# Patient Record
Sex: Female | Born: 1955 | Race: White | Hispanic: No | Marital: Married | Smoking: Never smoker
Health system: Southern US, Community
[De-identification: ages and names within clinical notes are randomized; demographics above are authoritative.]

## PROBLEM LIST (undated history)

## (undated) DIAGNOSIS — E079 Disorder of thyroid, unspecified: Secondary | ICD-10-CM

## (undated) DIAGNOSIS — K219 Gastro-esophageal reflux disease without esophagitis: Secondary | ICD-10-CM

## (undated) DIAGNOSIS — I10 Essential (primary) hypertension: Secondary | ICD-10-CM

## (undated) HISTORY — DX: Gastro-esophageal reflux disease without esophagitis: K21.9

## (undated) HISTORY — PX: COSMETIC SURGERY: SHX468

---

## 2004-08-08 HISTORY — PX: CHOLECYSTECTOMY: SHX55

## 2008-05-28 ENCOUNTER — Ambulatory Visit: Payer: Self-pay

## 2010-04-20 ENCOUNTER — Ambulatory Visit: Payer: Self-pay | Admitting: Endocrinology

## 2013-02-17 ENCOUNTER — Ambulatory Visit: Payer: Self-pay | Admitting: Emergency Medicine

## 2013-08-25 ENCOUNTER — Ambulatory Visit: Payer: Self-pay | Admitting: Physician Assistant

## 2014-03-14 DIAGNOSIS — E890 Postprocedural hypothyroidism: Secondary | ICD-10-CM | POA: Insufficient documentation

## 2017-05-16 ENCOUNTER — Ambulatory Visit: Payer: Self-pay | Admitting: Podiatry

## 2017-06-13 ENCOUNTER — Ambulatory Visit: Payer: Self-pay | Admitting: Podiatry

## 2017-09-04 ENCOUNTER — Telehealth: Payer: Self-pay | Admitting: Internal Medicine

## 2017-09-04 NOTE — Telephone Encounter (Signed)
Please advise    Copied from [place]. Topic: Inquiry >> Sep 04, 2017 12:54 PM Neva Seat wrote: Aveline's  Mother - In - Law - Jessica Priest  and  Chandler will no longer be a pts of Dr. Derrel Nip.  They are moving to [place] soon. Karalyne is asking since Dr. Derrel Nip will loose 2 pts if she can become a pt of Dr. Lupita Dawn.

## 2017-09-04 NOTE — Telephone Encounter (Signed)
Please advise 

## 2017-09-04 NOTE — Telephone Encounter (Signed)
I cannot accept any more patients at this time .  I 'm sorry

## 2017-09-05 NOTE — Telephone Encounter (Signed)
Pt called back and I informed her of message.

## 2017-09-05 NOTE — Telephone Encounter (Signed)
LMTCB. Need to let pt know that Dr. Derrel Nip is not accepting any pt's at this time and that she is sorry. PEC may speak with pt.

## 2017-10-27 ENCOUNTER — Encounter: Payer: Self-pay | Admitting: *Deleted

## 2017-10-27 ENCOUNTER — Ambulatory Visit
Admission: EM | Admit: 2017-10-27 | Discharge: 2017-10-27 | Disposition: A | Discharge status: Home / Self Care | Payer: Self-pay | Attending: Family Medicine | Admitting: Family Medicine

## 2017-10-27 DIAGNOSIS — H6503 Acute serous otitis media, bilateral: Secondary | ICD-10-CM

## 2017-10-27 HISTORY — DX: Essential (primary) hypertension: I10

## 2017-10-27 HISTORY — DX: Disorder of thyroid, unspecified: E07.9

## 2017-10-27 MED ORDER — AMOXICILLIN 875 MG PO TABS: 875.0000 mg | ORAL_TABLET | Freq: Two times a day (BID) | ORAL | 0 refills | Status: DC

## 2017-10-27 NOTE — ED Provider Notes (Signed)
[facility]    CSN: [id number] Arrival date & time: 10/27/17  1449     History   Chief Complaint Chief Complaint  Patient presents with  . Otalgia  . Facial Pain  . Headache    HPI DONEISHA IVEY is a 62 y.o. female.   The history is provided by the patient.  Otalgia  Location:  Bilateral (right greater than left) Quality:  Aching Severity:  Mild Onset quality:  Sudden Duration:  3 days Timing:  Constant Progression:  Worsening Context: recent URI   Context: not direct blow, not elevation change, not foreign body in ear, not loud noise and not water in ear   Relieved by:  None tried Associated symptoms: congestion and headaches   Associated symptoms: no abdominal pain, no cough, no diarrhea, no ear discharge, no fever, no hearing loss, no neck pain, no rash, no rhinorrhea, no sore throat, no tinnitus and no vomiting   Risk factors: no recent travel, no chronic ear infection and no prior ear surgery   Headache  Associated symptoms: congestion and ear pain   Associated symptoms: no abdominal pain, no cough, no diarrhea, no fever, no hearing loss, no neck pain, no sore throat and no vomiting     Past Medical History:  Diagnosis Date  . Hypertension   . Thyroid disease     There are no active problems to display for this patient.   Past Surgical History:  Procedure Laterality Date  . CHOLECYSTECTOMY      OB History   None      Home Medications    Prior to Admission medications   Medication Sig Start Date End Date Taking? Authorizing Provider  amoxicillin (AMOXIL) 875 MG tablet Take 1 tablet (875 mg total) by mouth 2 (two) times daily. 10/27/17   Norval Gable, MD    Family History Family History  Problem Relation Age of Onset  . Anuerysm Mother   . Congestive Heart Failure Father     Social History Social History   Tobacco Use  . Smoking status: Never Smoker  . Smokeless tobacco: Never Used  Substance Use Topics  . Alcohol  use: Yes  . Drug use: Never     Allergies   Patient has no known allergies.   Review of Systems Review of Systems  Constitutional: Negative for fever.  HENT: Positive for congestion and ear pain. Negative for ear discharge, hearing loss, rhinorrhea, sore throat and tinnitus.   Respiratory: Negative for cough.   Gastrointestinal: Negative for abdominal pain, diarrhea and vomiting.  Musculoskeletal: Negative for neck pain.  Skin: Negative for rash.  Neurological: Positive for headaches.     Physical Exam Triage Vital Signs ED Triage Vitals  Enc Vitals Group     BP 10/27/17 1513 132/83     Pulse Rate 10/27/17 1513 78     Resp 10/27/17 1513 16     Temp 10/27/17 1513 98.4 F (36.9 C)     Temp Source 10/27/17 1513 Oral     SpO2 10/27/17 1513 100 %     Weight 10/27/17 1519 200 lb (90.7 kg)     Height 10/27/17 1519 5\' 11"  (1.803 m)     Head Circumference --      Peak Flow --      Pain Score 10/27/17 1518 0     Pain Loc --      Pain Edu? --      Excl. in [place]? --    No  data found.  Updated Vital Signs BP 132/83 (BP Location: Left Arm)   Pulse 78   Temp 98.4 F (36.9 C) (Oral)   Resp 16   Ht 5\' 11"  (1.803 m)   Wt 200 lb (90.7 kg)   SpO2 100%   BMI 27.89 kg/m   Visual Acuity Right Eye Distance:   Left Eye Distance:   Bilateral Distance:    Right Eye Near:   Left Eye Near:    Bilateral Near:     Physical Exam  Constitutional: She appears well-developed and well-nourished. No distress.  HENT:  Head: Normocephalic and atraumatic.  Right Ear: External ear and ear canal normal. Tympanic membrane is erythematous and bulging.  Left Ear: External ear and ear canal normal. Tympanic membrane is erythematous and bulging.  Nose: Mucosal edema and rhinorrhea present. No nose lacerations, sinus tenderness, nasal deformity, septal deviation or nasal septal hematoma. No epistaxis.  No foreign bodies. Right sinus exhibits maxillary sinus tenderness and frontal sinus  tenderness. Left sinus exhibits maxillary sinus tenderness and frontal sinus tenderness.  Mouth/Throat: Uvula is midline, oropharynx is clear and moist and mucous membranes are normal. No oropharyngeal exudate.  Eyes: Pupils are equal, round, and reactive to light. Conjunctivae and EOM are normal. Right eye exhibits no discharge. Left eye exhibits no discharge. No scleral icterus.  Neck: Normal range of motion. Neck supple. No thyromegaly present.  Cardiovascular: Normal rate, regular rhythm and normal heart sounds.  Pulmonary/Chest: Effort normal and breath sounds normal. No respiratory distress. She has no wheezes. She has no rales.  Lymphadenopathy:    She has no cervical adenopathy.  Skin: She is not diaphoretic.  Nursing note and vitals reviewed.    UC Treatments / Results  Labs (all labs ordered are listed, but only abnormal results are displayed) Labs Reviewed - No data to display  EKG None Radiology No results found.  Procedures Procedures (including critical care time)  Medications Ordered in UC Medications - No data to display   Initial Impression / Assessment and Plan / UC Course  I have reviewed the triage vital signs and the nursing notes.  Pertinent labs & imaging results that were available during my care of the patient were reviewed by me and considered in my medical decision making (see chart for details).       Final Clinical Impressions(s) / UC Diagnoses   Final diagnoses:  Bilateral acute serous otitis media, recurrence not specified    ED Discharge Orders        Ordered    amoxicillin (AMOXIL) 875 MG tablet  2 times daily     10/27/17 1619     1. diagnosis reviewed with patient 2. rx as per orders above; reviewed possible side effects, interactions, risks and benefits  3. Recommend supportive treatment with otc analgesics prn 4. Follow-up prn if symptoms worsen or don't improve  Controlled Substance Prescriptions [place] Controlled Substance  Registry consulted? Not Applicable   Norval Gable, MD 10/27/17 [phone]

## 2017-10-27 NOTE — ED Triage Notes (Signed)
Headache, right ear pain with "roaring" sensation and muted hearing, nasal discharge, head congestion since yesterday.

## 2017-11-20 DIAGNOSIS — E669 Obesity, unspecified: Secondary | ICD-10-CM | POA: Insufficient documentation

## 2017-11-20 DIAGNOSIS — I10 Essential (primary) hypertension: Secondary | ICD-10-CM | POA: Insufficient documentation

## 2017-11-21 ENCOUNTER — Ambulatory Visit: Payer: Self-pay | Admitting: Podiatry

## 2017-11-21 ENCOUNTER — Encounter: Payer: Self-pay | Admitting: Podiatry

## 2017-11-21 DIAGNOSIS — B351 Tinea unguium: Secondary | ICD-10-CM

## 2017-11-21 MED ORDER — TERBINAFINE HCL 250 MG PO TABS: 250.0000 mg | ORAL_TABLET | Freq: Every day | ORAL | 0 refills | Status: DC

## 2017-11-22 LAB — HEPATIC FUNCTION PANEL
ALBUMIN: 4.7 g/dL (ref 3.6–4.8)
ALT: 20 IU/L (ref 0–32)
AST: 17 IU/L (ref 0–40)
Alkaline Phosphatase: 82 IU/L (ref 39–117)
BILIRUBIN TOTAL: 0.9 mg/dL (ref 0.0–1.2)
BILIRUBIN, DIRECT: 0.18 mg/dL (ref 0.00–0.40)
Total Protein: 7.1 g/dL (ref 6.0–8.5)

## 2017-11-24 NOTE — Progress Notes (Signed)
   Subjective: 62 year old female presenting today as a new patient with a chief complaint of a thickened, discolored right great toe that appeared about one year ago. She reports associated pain to the nail bed and distal portion of the nail. Wearing shoes increases this pain. She has not had any treatment. Patient is here for further evaluation and treatment.   Past Medical History:  Diagnosis Date  . Hypertension   . Thyroid disease     Objective: Physical Exam General: The patient is alert and oriented x3 in no acute distress.  Dermatology: Hyperkeratotic, discolored, thickened, onychodystrophy of the right great toenail.  Skin is warm, dry and supple bilateral lower extremities. Negative for open lesions or macerations.  Vascular: Palpable pedal pulses bilaterally. No edema or erythema noted. Capillary refill within normal limits.  Neurological: Epicritic and protective threshold grossly intact bilaterally.   Musculoskeletal Exam: Range of motion within normal limits to all pedal and ankle joints bilateral. Muscle strength 5/5 in all groups bilateral.   Assessment: #1  Onychomycosis right great toe  Plan of Care:  #1 Patient was evaluated. #2  Recommend over-the-counter antifungal medication. #3 recommend good supportive shoe gear that is not too tight in the toe box area #4 return to clinic as needed   Edrick Kins, DPM [facility]  Dr. Edrick Kins, [address]                                        [place], [place] [address]                Office [phone]  Fax [phone]

## 2017-11-28 ENCOUNTER — Telehealth: Payer: Self-pay | Admitting: *Deleted

## 2017-11-28 NOTE — Telephone Encounter (Signed)
Pt states she would like to know the results of her liver function test so she can begin the lamisil.

## 2017-11-29 NOTE — Telephone Encounter (Signed)
Patient has been notified via voice mail that labs normal, she may start taking the Lamisil daily and if any problems with the medication, she is to d/c medication and contact our office

## 2017-12-24 ENCOUNTER — Encounter: Payer: Self-pay | Admitting: Gynecology

## 2017-12-24 ENCOUNTER — Ambulatory Visit (INDEPENDENT_AMBULATORY_CARE_PROVIDER_SITE_OTHER): Payer: Self-pay

## 2017-12-24 ENCOUNTER — Ambulatory Visit
Admission: EM | Admit: 2017-12-24 | Discharge: 2017-12-24 | Disposition: A | Discharge status: Home / Self Care | Payer: Self-pay | Attending: Family Medicine | Admitting: Family Medicine

## 2017-12-24 ENCOUNTER — Other Ambulatory Visit: Payer: Self-pay

## 2017-12-24 DIAGNOSIS — J40 Bronchitis, not specified as acute or chronic: Secondary | ICD-10-CM

## 2017-12-24 DIAGNOSIS — R05 Cough: Secondary | ICD-10-CM

## 2017-12-24 MED ORDER — HYDROCOD POLST-CPM POLST ER 10-8 MG/5ML PO SUER: 5.0000 mL | Freq: Every evening | ORAL | 0 refills | Status: DC | PRN

## 2017-12-24 MED ORDER — PREDNISONE 50 MG PO TABS: ORAL_TABLET | ORAL | 0 refills | Status: DC

## 2017-12-24 MED ORDER — DOXYCYCLINE HYCLATE 100 MG PO CAPS: 100.0000 mg | ORAL_CAPSULE | Freq: Two times a day (BID) | ORAL | 0 refills | Status: DC

## 2017-12-24 NOTE — ED Provider Notes (Signed)
[facility]    CSN: [id number] Arrival date & time: 12/24/17  1235  History   Chief Complaint Chief Complaint  Patient presents with  . Cough   HPI  62 year old female presents with cough.  Patient reports that she has not felt well for the past 3 weeks.  She has had upper respiratory symptoms but now is plagued by productive cough.  Associated congestion and fatigue.  She has failed to improve despite time and rest.  No reports of fever.  No shortness of breath.  No known exacerbating relieving factors.  No other associated symptoms.  No other complaints.  Past Medical History:  Diagnosis Date  . Hypertension   . Thyroid disease    Patient Active Problem List   Diagnosis Date Noted  . Obesity (BMI 30-39.9) 11/20/2017  . Hypertension 11/20/2017  . 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 CHOLECYSTECTOMY     OB History   None    Home Medications    Prior to Admission medications   Medication Sig Start Date End Date Taking? Authorizing Provider  amLODipine (NORVASC) 5 MG tablet  10/04/17  Yes [provider]  ARMOUR THYROID 90 MG tablet TK 1 T PO D FOR 6 DAYS OF THE WEEK AND 2 TS ON SUNDAYS UTD 08/30/17  Yes [provider]  chlorpheniramine-HYDROcodone (TUSSIONEX PENNKINETIC ER) 10-8 MG/5ML SUER Take 5 mLs by mouth at bedtime as needed. 12/24/17   Coral Spikes, DO  doxycycline (VIBRAMYCIN) 100 MG capsule Take 1 capsule (100 mg total) by mouth 2 (two) times daily. 12/24/17   Coral Spikes, DO  lisinopril (PRINIVIL,ZESTRIL) 40 MG tablet  10/03/17   [provider]  predniSONE (DELTASONE) 50 MG tablet 1 tablet daily x 5 days. 12/24/17   Coral Spikes, DO  terbinafine (LAMISIL) 250 MG tablet Take 1 tablet (250 mg total) by mouth daily. 11/21/17   Edrick Kins, DPM   Family History Family History  Problem Relation Age of Onset  . Anuerysm Mother   . Congestive Heart Failure Father      Social History Social History   Tobacco Use  . Smoking status: Never Smoker  . Smokeless tobacco: Never Used  Substance Use Topics  . Alcohol use: Yes  . Drug use: Never   Allergies   Patient has no known allergies.  Review of Systems Review of Systems  Constitutional: Positive for fatigue.  HENT: Positive for congestion.   Respiratory: Positive for cough.    Physical Exam Triage Vital Signs ED Triage Vitals  Enc Vitals Group     BP 12/24/17 1245 (!) 139/95     Pulse Rate 12/24/17 1245 85     Resp 12/24/17 1245 16     Temp 12/24/17 1245 (!) 97.5 F (36.4 C)     Temp Source 12/24/17 1245 Oral     SpO2 12/24/17 1245 96 %     Weight 12/24/17 1248 200 lb (90.7 kg)     Height --      Head Circumference --      Peak Flow --      Pain Score 12/24/17 1248 0     Pain Loc --      Pain Edu? --      Excl. in [place]? --    Updated Vital Signs BP (!) 139/95 (BP Location: Left Arm)   Pulse 85   Temp (!) 97.5 F (36.4 C) (Oral)   Resp  16   Wt 200 lb (90.7 kg)   SpO2 96%   BMI 27.89 kg/m   Physical Exam  Constitutional: She is oriented to person, place, and time. She appears well-developed. No distress.  HENT:  Head: Normocephalic and atraumatic.  Nose: Nose normal.  Eyes: Conjunctivae are normal. Right eye exhibits no discharge. Left eye exhibits no discharge.  Cardiovascular: Normal rate and regular rhythm.  Pulmonary/Chest: Effort normal and breath sounds normal. She has no wheezes. She has no rales.  Neurological: She is alert and oriented to person, place, and time.  Psychiatric: She has a normal mood and affect. Her behavior is normal.  Nursing note and vitals reviewed.  UC Treatments / Results  Labs (all labs ordered are listed, but only abnormal results are displayed) Labs Reviewed - No data to display  EKG None  Radiology Dg Chest 2 View  Result Date: 12/24/2017 CLINICAL DATA:  Cough for 3 weeks EXAM: CHEST - 2 VIEW COMPARISON:  None. FINDINGS: Normal  heart size. Convexity of the right mediastinum is attributed to ascending aortic contour. The superimposed hilum has a normal morphology and hila appear normal on the lateral view. Biapical pleural based calcification. There is no edema, consolidation, effusion, or pneumothorax. IMPRESSION: Negative for pneumonia. Electronically Signed   By: Monte Fantasia M.D.   On: 12/24/2017 13:47   Procedures Procedures (including critical care time)  Medications Ordered in UC Medications - No data to display  Initial Impression / Assessment and Plan / UC Course  I have reviewed the triage vital signs and the nursing notes.  Pertinent labs & imaging results that were available during my care of the patient were reviewed by me and considered in my medical decision making (see chart for details).    62 year old female presents with symptoms consistent with bronchitis.  Given duration of illness and lack of improvement, I am treating her with doxycycline, prednisone.  Tussionex for cough.  Final Clinical Impressions(s) / UC Diagnoses   Final diagnoses:  Bronchitis     Discharge Instructions     Meds as prescribed.  Take care  Dr. Lacinda Axon     ED Prescriptions    Medication Sig Dispense Auth. Provider   doxycycline (VIBRAMYCIN) 100 MG capsule Take 1 capsule (100 mg total) by mouth 2 (two) times daily. 14 capsule Amahd Morino G, DO   predniSONE (DELTASONE) 50 MG tablet 1 tablet daily x 5 days. 5 tablet [place], Debra Calabretta G, DO   chlorpheniramine-HYDROcodone (TUSSIONEX PENNKINETIC ER) 10-8 MG/5ML SUER Take 5 mLs by mouth at bedtime as needed. 60 mL Coral Spikes, DO     Controlled Substance Prescriptions [place] Controlled Substance Registry consulted? Not Applicable   Coral Spikes, DO 12/24/17 1405

## 2017-12-24 NOTE — ED Triage Notes (Signed)
Pt. C/o x 2-3 weeks cold symptom / cough / congestion.

## 2017-12-24 NOTE — Discharge Instructions (Signed)
Meds as prescribed. ° °Take care ° °Dr. Evelette Hollern  °

## 2018-01-26 ENCOUNTER — Telehealth: Payer: Self-pay | Admitting: Podiatry

## 2018-01-26 NOTE — Telephone Encounter (Signed)
I told pt that if the nail was still attached and she had bleeding that meant the skin was broken somewhere on the toe, that she should soak in 1/4 epsom salt and 1 qt warm water for 20 minutes after her shower and put neosporin ointment around the nail borders and cover with a bandaid and protect with a bandaid to keep from catching and being pulled off. I told pt to make an appt if she had any problems and we could take the toenail off for her. Pt states she will call if needed.

## 2018-01-26 NOTE — Telephone Encounter (Signed)
I was moving furniture yesterday without any shoes on. It pulled it my large left toenail off, not completley. It bled a lot, but its not terribly painful. I can that toenail is not attached now towards the top as its kind of white now and didn't collect blood. I just wondered what I should do to keep and protect that nail? If you could call me back please at [phone]. Thank you.

## 2018-02-26 ENCOUNTER — Ambulatory Visit: Payer: Self-pay | Admitting: Internal Medicine

## 2018-03-05 ENCOUNTER — Ambulatory Visit (INDEPENDENT_AMBULATORY_CARE_PROVIDER_SITE_OTHER): Payer: Self-pay | Admitting: Internal Medicine

## 2018-03-05 ENCOUNTER — Encounter: Payer: Self-pay | Admitting: Internal Medicine

## 2018-03-05 VITALS — BP 122/86 | HR 88 | Temp 98.1°F | Resp 15 | Ht 71.0 in | Wt 226.8 lb

## 2018-03-05 DIAGNOSIS — Z79899 Other long term (current) drug therapy: Secondary | ICD-10-CM

## 2018-03-05 DIAGNOSIS — Z1239 Encounter for other screening for malignant neoplasm of breast: Secondary | ICD-10-CM

## 2018-03-05 DIAGNOSIS — Z1231 Encounter for screening mammogram for malignant neoplasm of breast: Secondary | ICD-10-CM

## 2018-03-05 DIAGNOSIS — I10 Essential (primary) hypertension: Secondary | ICD-10-CM

## 2018-03-05 DIAGNOSIS — E669 Obesity, unspecified: Secondary | ICD-10-CM

## 2018-03-05 DIAGNOSIS — Z1211 Encounter for screening for malignant neoplasm of colon: Secondary | ICD-10-CM

## 2018-03-05 DIAGNOSIS — E890 Postprocedural hypothyroidism: Secondary | ICD-10-CM

## 2018-03-05 MED ORDER — AMLODIPINE BESYLATE 5 MG PO TABS: 5.0000 mg | ORAL_TABLET | Freq: Every day | ORAL | 1 refills | Status: DC

## 2018-03-05 MED ORDER — ARMOUR THYROID 90 MG PO TABS: ORAL_TABLET | ORAL | 3 refills | Status: DC

## 2018-03-05 MED ORDER — LISINOPRIL 40 MG PO TABS: 40.0000 mg | ORAL_TABLET | Freq: Every day | ORAL | 1 refills | Status: DC

## 2018-03-05 NOTE — Patient Instructions (Addendum)
  To prevent recurrent infections for your students:  You should try  Using [employer]'s Sinus rinse when you come home from work, ;  It is a strong sinus "flush" using water and medicated salts.  Do it over the sink because it can be a bit messy   The new goals for optimal blood pressure management are 120/70.  Please check your blood pressure a few times at home and send me the readings so I can determine if you need a change in medication   [employer] makes  Very accurate BP machines for home use   Return for liver enzymes 3-4 weeks after starting  terbinafine   Then we can check them again after 3 months if additional therapy is needed   I will order your mammogram and   GI referral

## 2018-03-05 NOTE — Progress Notes (Signed)
Subjective:  Patient ID: Brenda Carr, female    DOB: [birth year]  Age: 62 y.o. MRN: [id number]  CC: The primary encounter diagnosis was Breast cancer screening. Diagnoses of Colon cancer screening, Long-term use of high-risk medication, Postablative hypothyroidism, Obesity (BMI 30-39.9), and Essential hypertension were also pertinent to this visit.  HPI Brenda Carr presents for  establishment of care.    istory of Graves Hyperthyroidism undiagnosed for over a year.  Diagnosed and treated with ablation therapy in 2005. Managed previously by Dr Eddie Dibbles,  intoelrant of Synthroid,  Using Armour .    last tsh 1.54 in October   Last PAP 2009 , no history of abnormal PAPs.  Menopause in 2009 will return for PAP ,   Last mammogram 2011  Ordered today  Last colonoscopy 2004 , normal.  Was done due to persistent abdominal  symptoms while living in [place] .    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  Has not started it yet,  Big toe only.    History Brenda Carr has a past medical history of GERD (gastroesophageal reflux disease), Hypertension, and Thyroid disease.   She has a past surgical history that includes Cholecystectomy (2006).   Her family history includes Anuerysm (age of onset: 29) in her mother; Cancer in her paternal grandmother; Cancer (age of onset: 43) in her maternal grandmother; Cancer - Colon (age of onset: 12) in her paternal aunt; Congestive Heart Failure (age of onset: 77) in her father; Early death in her maternal grandmother and mother; Hearing loss in her brother and father; Heart disease in her father and maternal grandfather; Hypertension in her brother and brother; Mental illness in her paternal grandfather.She reports that she has never smoked. She has never used smokeless tobacco. She reports that she drinks alcohol. She reports that she  does not use drugs.  Outpatient Medications Prior to Visit  Medication Sig Dispense Refill  . amLODipine (NORVASC) 5 MG tablet   2  . ARMOUR THYROID 90 MG tablet TK 1 T PO D FOR 6 DAYS OF THE WEEK AND 2 TS ON SUNDAYS UTD  3  . lisinopril (PRINIVIL,ZESTRIL) 40 MG tablet   3  . terbinafine (LAMISIL) 250 MG tablet Take 1 tablet (250 mg total) by mouth daily. (Patient not taking: Reported on 03/05/2018) 90 tablet 0  . chlorpheniramine-HYDROcodone (TUSSIONEX PENNKINETIC ER) 10-8 MG/5ML SUER Take 5 mLs by mouth at bedtime as needed. (Patient not taking: Reported on 03/05/2018) 60 mL 0  . doxycycline (VIBRAMYCIN) 100 MG capsule Take 1 capsule (100 mg total) by mouth 2 (two) times daily. (Patient not taking: Reported on 03/05/2018) 14 capsule 0  . predniSONE (DELTASONE) 50 MG tablet 1 tablet daily x 5 days. (Patient not taking: Reported on 03/05/2018) 5 tablet 0   No facility-administered medications prior to visit.     Review of Systems:  Patient denies headache, fevers, malaise, unintentional weight loss, skin rash, eye pain, sinus congestion and sinus pain, sore throat, dysphagia,  hemoptysis , cough, dyspnea, wheezing, chest pain, palpitations, orthopnea, edema, abdominal pain, nausea, melena, diarrhea, constipation, flank pain, dysuria, hematuria, urinary  Frequency, nocturia, numbness, tingling, seizures,  Focal weakness, Loss of consciousness,  Tremor, insomnia, depression, anxiety, and suicidal ideation.     Objective:  BP 122/86 (BP Location: Left Arm, Patient Position: Sitting, Cuff Size: Large)   Pulse 88  Temp 98.1 F (36.7 C) (Oral)   Resp 15   Ht 5\' 11"  (1.803 m)   Wt 226 lb 12.8 oz (102.9 kg)   SpO2 96%   BMI 31.63 kg/m   Physical Exam:  General appearance: alert, cooperative and appears stated age Ears: normal TM's and external ear canals both ears Throat: lips, mucosa, and tongue normal; teeth and gums normal Neck: no adenopathy, no carotid bruit, supple, symmetrical,  trachea midline and thyroid not enlarged, symmetric, no tenderness/mass/nodules Back: symmetric, no curvature. ROM normal. No CVA tenderness. Lungs: clear to auscultation bilaterally Heart: regular rate and rhythm, S1, S2 normal, no murmur, click, rub or gallop Abdomen: soft, non-tender; bowel sounds normal; no masses,  no organomegaly Pulses: 2+ and symmetric Skin: Skin color, texture, turgor normal. No rashes or lesions Lymph nodes: Cervical, supraclavicular, and axillary nodes normal. Ext: thickening of great toe nail on right foot noted.    Assessment & Plan:   Problem List Items Addressed This Visit    Postablative hypothyroidism    Managed with Armour thyroid due to history of intolerance to Synthroid.  Last TSH Oct 2018 at [place] .   Repeat TSH in October.        Relevant Medications   ARMOUR THYROID 90 MG tablet   Obesity (BMI 30-39.9)    I have addressed  BMI and recommended wt loss of 10% of body weight over the next 6 months using a low glycemic index diet and regular exercise a minimum of 5 days per week.  Screening a1c wasa 5.3 last October at [facility]        Hypertension    Managed with lisinopril and amlodipine.  Last CMET was Oct 2018 at [place].   No results found for: NA, K, CL, CO2       Relevant Medications   lisinopril (PRINIVIL,ZESTRIL) 40 MG tablet   amLODipine (NORVASC) 5 MG tablet    Other Visit Diagnoses    Breast cancer screening    -  Primary   Relevant Orders   MM 3D SCREEN BREAST BILATERAL   Colon cancer screening       Relevant Orders   Ambulatory referral to Gastroenterology   Long-term use of high-risk medication       Relevant Orders   Comprehensive metabolic panel     A total of 45 minutes of face to face time was spent with patient more than half of which was spent in reviewing her past treatment plans and records form [place] [facility],   counselling and coordination of care  I have discontinued Sakia Schrimpf. Feldpausch's  doxycycline, predniSONE, and chlorpheniramine-HYDROcodone. I have also changed her lisinopril and amLODipine. Additionally, I am having her maintain her terbinafine and ARMOUR THYROID.  Meds ordered this encounter  Medications  . ARMOUR THYROID 90 MG tablet    Sig: TK 1 T PO D FOR 6 DAYS OF THE WEEK AND 2 TS ON SUNDAYS UTD    Dispense:  90 tablet    Refill:  3  . lisinopril (PRINIVIL,ZESTRIL) 40 MG tablet    Sig: Take 1 tablet (40 mg total) by mouth daily.    Dispense:  90 tablet    Refill:  1  . amLODipine (NORVASC) 5 MG tablet    Sig: Take 1 tablet (5 mg total) by mouth daily.    Dispense:  90 tablet    Refill:  1    Medications Discontinued During This Encounter  Medication Reason  . chlorpheniramine-HYDROcodone [facility]  ER) 10-8 MG/5ML SUER Completed Course  . doxycycline (VIBRAMYCIN) 100 MG capsule Completed Course  . predniSONE (DELTASONE) 50 MG tablet Completed Course  . ARMOUR THYROID 90 MG tablet Reorder  . lisinopril (PRINIVIL,ZESTRIL) 40 MG tablet Reorder  . amLODipine (NORVASC) 5 MG tablet Reorder    Follow-up: Return in about 6 months (around 09/05/2018) for CPE with PAP .   Crecencio Mc, MD

## 2018-03-06 NOTE — Assessment & Plan Note (Signed)
Managed with Armour thyroid due to history of intolerance to Synthroid.  Last TSH Oct 2018 at [place] .   Repeat TSH in October.

## 2018-03-06 NOTE — Assessment & Plan Note (Signed)
I have addressed  BMI and recommended wt loss of 10% of body weight over the next 6 months using a low glycemic index diet and regular exercise a minimum of 5 days per week.  Screening a1c wasa 5.3 last October at [place] [facility]

## 2018-03-06 NOTE — Assessment & Plan Note (Signed)
Managed with lisinopril and amlodipine.  Last CMET was Oct 2018 at [place].   No results found for: NA, K, CL, CO2

## 2018-03-11 ENCOUNTER — Encounter: Payer: Self-pay | Admitting: Internal Medicine

## 2018-03-13 ENCOUNTER — Encounter: Payer: Self-pay | Admitting: *Deleted

## 2018-03-22 ENCOUNTER — Other Ambulatory Visit: Payer: Self-pay | Admitting: Podiatry

## 2018-03-22 ENCOUNTER — Telehealth: Payer: Self-pay | Admitting: Gastroenterology

## 2018-03-22 NOTE — Telephone Encounter (Signed)
Patient received a letter to schedule colonoscopy. Please call

## 2018-03-23 ENCOUNTER — Telehealth: Payer: Self-pay

## 2018-03-23 ENCOUNTER — Other Ambulatory Visit: Payer: Self-pay

## 2018-03-23 DIAGNOSIS — Z1211 Encounter for screening for malignant neoplasm of colon: Secondary | ICD-10-CM

## 2018-03-23 NOTE — Telephone Encounter (Signed)
Patient has been scheduled for her screening colonoscopy 06/04/18 [place] with Dr. Allen Norris.  Triage completed.

## 2018-04-03 ENCOUNTER — Other Ambulatory Visit: Payer: Self-pay

## 2018-04-10 ENCOUNTER — Encounter: Payer: Self-pay | Admitting: Internal Medicine

## 2018-05-24 ENCOUNTER — Other Ambulatory Visit: Payer: Self-pay

## 2018-05-24 ENCOUNTER — Encounter: Payer: Self-pay | Admitting: *Deleted

## 2018-06-04 ENCOUNTER — Encounter: Payer: Self-pay | Admitting: *Deleted

## 2018-06-04 ENCOUNTER — Other Ambulatory Visit: Payer: Self-pay

## 2018-06-07 NOTE — Discharge Instructions (Signed)
General Anesthesia, Adult, Care After °These instructions provide you with information about caring for yourself after your procedure. Your health care provider may also give you more specific instructions. Your treatment has been planned according to current medical practices, but problems sometimes occur. Call your health care provider if you have any problems or questions after your procedure. °What can I expect after the procedure? °After the procedure, it is common to have: °· Vomiting. °· A sore throat. °· Mental slowness. ° °It is common to feel: °· Nauseous. °· Cold or shivery. °· Sleepy. °· Tired. °· Sore or achy, even in parts of your body where you did not have surgery. ° °Follow these instructions at home: °For at least 24 hours after the procedure: °· Do not: °? Participate in activities where you could fall or become injured. °? Drive. °? Use heavy machinery. °? Drink alcohol. °? Take sleeping pills or medicines that cause drowsiness. °? Make important decisions or sign legal documents. °? Take care of children on your own. °· Rest. °Eating and drinking °· If you vomit, drink water, juice, or soup when you can drink without vomiting. °· Drink enough fluid to keep your urine clear or pale yellow. °· Make sure you have little or no nausea before eating solid foods. °· Follow the diet recommended by your health care provider. °General instructions °· Have a responsible adult stay with you until you are awake and alert. °· Return to your normal activities as told by your health care provider. Ask your health care provider what activities are safe for you. °· Take over-the-counter and prescription medicines only as told by your health care provider. °· If you smoke, do not smoke without supervision. °· Keep all follow-up visits as told by your health care provider. This is important. °Contact a health care provider if: °· You continue to have nausea or vomiting at home, and medicines are not helpful. °· You  cannot drink fluids or start eating again. °· You cannot urinate after 8-12 hours. °· You develop a skin rash. °· You have fever. °· You have increasing redness at the site of your procedure. °Get help right away if: °· You have difficulty breathing. °· You have chest pain. °· You have unexpected bleeding. °· You feel that you are having a life-threatening or urgent problem. °This information is not intended to replace advice given to you by your health care provider. Make sure you discuss any questions you have with your health care provider. °Document Released: 10/31/2000 Document Revised: 12/28/2015 Document Reviewed: 07/09/2015 °Elsevier Interactive Patient Education © 2018 [employer] Inc[employer] ° °

## 2018-06-11 ENCOUNTER — Ambulatory Visit
Admission: RE | Disposition: A | Discharge status: Home / Self Care | Payer: Self-pay | Source: Ambulatory Visit | Attending: Gastroenterology

## 2018-06-11 ENCOUNTER — Ambulatory Visit: Payer: Self-pay | Admitting: Anesthesiology

## 2018-06-11 ENCOUNTER — Ambulatory Visit
Admission: RE | Admit: 2018-06-11 | Discharge: 2018-06-11 | Disposition: A | Discharge status: Home / Self Care | Payer: Self-pay | Source: Ambulatory Visit | Attending: Gastroenterology | Admitting: Gastroenterology

## 2018-06-11 DIAGNOSIS — Z800 Family history of malignant neoplasm of digestive organs: Secondary | ICD-10-CM | POA: Insufficient documentation

## 2018-06-11 DIAGNOSIS — E039 Hypothyroidism, unspecified: Secondary | ICD-10-CM | POA: Insufficient documentation

## 2018-06-11 DIAGNOSIS — Z79890 Hormone replacement therapy (postmenopausal): Secondary | ICD-10-CM | POA: Insufficient documentation

## 2018-06-11 DIAGNOSIS — D122 Benign neoplasm of ascending colon: Secondary | ICD-10-CM | POA: Insufficient documentation

## 2018-06-11 DIAGNOSIS — Z8249 Family history of ischemic heart disease and other diseases of the circulatory system: Secondary | ICD-10-CM | POA: Insufficient documentation

## 2018-06-11 DIAGNOSIS — I10 Essential (primary) hypertension: Secondary | ICD-10-CM | POA: Insufficient documentation

## 2018-06-11 DIAGNOSIS — Z79899 Other long term (current) drug therapy: Secondary | ICD-10-CM | POA: Insufficient documentation

## 2018-06-11 DIAGNOSIS — Z1211 Encounter for screening for malignant neoplasm of colon: Secondary | ICD-10-CM

## 2018-06-11 HISTORY — PX: POLYPECTOMY: SHX5525

## 2018-06-11 HISTORY — PX: COLONOSCOPY WITH PROPOFOL: SHX5780

## 2018-06-11 SURGERY — COLONOSCOPY WITH PROPOFOL
Anesthesia: General | Site: Rectum

## 2018-06-11 MED ORDER — LIDOCAINE HCL (CARDIAC) PF 100 MG/5ML IV SOSY
PREFILLED_SYRINGE | INTRAVENOUS | Status: DC | PRN
  Administered 2018-06-11: 20 mg via INTRAVENOUS

## 2018-06-11 MED ORDER — LACTATED RINGERS IV SOLN
10.0000 mL/h | INTRAVENOUS | Status: DC
  Administered 2018-06-11: 09:00:00 via INTRAVENOUS

## 2018-06-11 MED ORDER — ONDANSETRON HCL 4 MG/2ML IJ SOLN: 4.0000 mg | Freq: Once | INTRAMUSCULAR | Status: DC | PRN

## 2018-06-11 MED ORDER — PROPOFOL 10 MG/ML IV BOLUS
INTRAVENOUS | Status: DC | PRN
  Administered 2018-06-11 (×2): 20 mg via INTRAVENOUS
  Administered 2018-06-11: 100 mg via INTRAVENOUS
  Administered 2018-06-11 (×2): 30 mg via INTRAVENOUS
  Administered 2018-06-11: 20 mg via INTRAVENOUS
  Administered 2018-06-11 (×2): 30 mg via INTRAVENOUS
  Administered 2018-06-11: 20 mg via INTRAVENOUS
  Administered 2018-06-11: 30 mg via INTRAVENOUS
  Administered 2018-06-11 (×2): 20 mg via INTRAVENOUS

## 2018-06-11 MED ORDER — STERILE WATER FOR IRRIGATION IR SOLN
Status: DC | PRN
  Administered 2018-06-11: 09:00:00

## 2018-06-11 SURGICAL SUPPLY — 13 items
CANISTER SUCT 1200ML W/VALVE (MISCELLANEOUS) ×4 IMPLANT
CLIP HMST 235XBRD CATH ROT (MISCELLANEOUS) ×4 IMPLANT
CLIP RESOLUTION 360 11X235 (MISCELLANEOUS) ×4
ELECT REM PT RETURN 9FT ADLT (ELECTROSURGICAL) ×4
ELECTRODE REM PT RTRN 9FT ADLT (ELECTROSURGICAL) ×2 IMPLANT
GOWN CVR UNV OPN BCK APRN NK (MISCELLANEOUS) ×4 IMPLANT
GOWN ISOL THUMB LOOP REG UNIV (MISCELLANEOUS) ×4
KIT ENDO PROCEDURE OLY (KITS) ×4 IMPLANT
MARKER SPOT ENDO TATTOO 5ML (MISCELLANEOUS) IMPLANT
SNARE SHORT THROW 13M SML OVAL (MISCELLANEOUS) ×4 IMPLANT
SPOT EX ENDOSCOPIC TATTOO (MISCELLANEOUS)
TRAP ETRAP POLY (MISCELLANEOUS) ×4 IMPLANT
WATER STERILE IRR 250ML POUR (IV SOLUTION) ×4 IMPLANT

## 2018-06-11 NOTE — Transfer of Care (Signed)
Immediate Anesthesia Transfer of Care Note  Patient: Brenda Carr  Procedure(s) Performed: COLONOSCOPY WITH BIOPSIES (N/A ) POLYPECTOMY (N/A Rectum)  Patient Location: PACU  Anesthesia Type: General  Level of Consciousness: awake, alert  and patient cooperative  Airway and Oxygen Therapy: Patient Spontanous Breathing and Patient connected to supplemental oxygen  Post-op Assessment: Post-op Vital signs reviewed, Patient's Cardiovascular Status Stable, Respiratory Function Stable, Patent Airway and No signs of Nausea or vomiting  Post-op Vital Signs: Reviewed and stable  Complications: No apparent anesthesia complications

## 2018-06-11 NOTE — Anesthesia Procedure Notes (Signed)
Date/Time: 06/11/2018 8:41 AM Performed by: Janna Arch, CRNA Pre-anesthesia Checklist: Patient identified, Emergency Drugs available, Suction available, Timeout performed and Patient being monitored Patient Re-evaluated:Patient Re-evaluated prior to induction Oxygen Delivery Method: Nasal cannula Placement Confirmation: positive ETCO2

## 2018-06-11 NOTE — H&P (Signed)
Brenda Lame, MD [address] [place], [place] [address] Phone: [phone] Fax : [phone]  Primary Care Physician:  Brenda Mc, MD Primary Gastroenterologist:  Dr. Allen Carr  Pre-Procedure History & Physical: HPI:  Brenda Carr is a 62 y.o. female is here for a screening colonoscopy.   Past Medical History:  Diagnosis Date  . GERD (gastroesophageal reflux disease)   . Hypertension   . Thyroid disease    graves disease. Thyroid  ablated.    Past Surgical History:  Procedure Laterality Date  . CHOLECYSTECTOMY  2006    Prior to Admission medications   Medication Sig Start Date End Date Taking? Authorizing Provider  amLODipine (NORVASC) 5 MG tablet Take 1 tablet (5 mg total) by mouth daily. 03/05/18  Yes Brenda Mc, MD  ARMOUR THYROID 90 MG tablet TK 1 T PO D FOR 6 DAYS OF THE WEEK AND 2 TS ON SUNDAYS UTD 03/05/18  Yes Brenda Mc, MD  lisinopril (PRINIVIL,ZESTRIL) 40 MG tablet Take 1 tablet (40 mg total) by mouth daily. 03/05/18  Yes Brenda Mc, MD    Allergies as of 03/23/2018  . (No Known Allergies)    Family History  Problem Relation Age of Onset  . Anuerysm Mother 44       aortic   . Early death Mother   . Congestive Heart Failure Father 57  . Hearing loss Father   . Heart disease Father   . Hearing loss Brother   . Hypertension Brother   . Cancer Maternal Grandmother 56       colon   . Early death Maternal Grandmother   . Heart disease Maternal Grandfather   . Cancer Paternal Grandmother   . Mental illness Paternal Grandfather   . Hypertension Brother   . Cancer - Colon Paternal Aunt 21       colon    Social History   Socioeconomic History  . Marital status: Married    Spouse name: Not on file  . Number of children: Not on file  . Years of education: Not on file  . Highest education level: Not on file  Occupational History  . Not on file  Social Needs  . Financial resource strain: Not on file  . Food  insecurity:    Worry: Not on file    Inability: Not on file  . Transportation needs:    Medical: Not on file    Non-medical: Not on file  Tobacco Use  . Smoking status: Never Smoker  . Smokeless tobacco: Never Used  Substance and Sexual Activity  . Alcohol use: Yes    Comment: 3-4x/yr  . Drug use: Never  . Sexual activity: Not Currently  Lifestyle  . Physical activity:    Days per week: Not on file    Minutes per session: Not on file  . Stress: Not on file  Relationships  . Social connections:    Talks on phone: Not on file    Gets together: Not on file    Attends religious service: Not on file    Active member of club or organization: Not on file    Attends meetings of clubs or organizations: Not on file    Relationship status: Not on file  . Intimate partner violence:    Fear of current or ex partner: Not on file    Emotionally abused: Not on file    Physically abused: Not on file    Forced sexual activity:  Not on file  Other Topics Concern  . Not on file  Social History Narrative  . Not on file    Review of Systems: See HPI, otherwise negative ROS  Physical Exam: BP 132/76   Pulse (!) 102   Resp 15   Ht 5\' 11"  (1.803 m)   Wt 100.7 kg   SpO2 98%   BMI 30.96 kg/m  General:   Alert,  pleasant and cooperative in NAD Head:  Normocephalic and atraumatic. Neck:  Supple; no masses or thyromegaly. Lungs:  Clear throughout to auscultation.    Heart:  Regular rate and rhythm. Abdomen:  Soft, nontender and nondistended. Normal bowel sounds, without guarding, and without rebound.   Neurologic:  Alert and  oriented x4;  grossly normal neurologically.  Impression/Plan: Brenda Carr is now here to undergo a screening colonoscopy.  Risks, benefits, and alternatives regarding colonoscopy have been reviewed with the patient.  Questions have been answered.  All parties agreeable.

## 2018-06-11 NOTE — Anesthesia Postprocedure Evaluation (Signed)
Anesthesia Post Note  Patient: Brenda Carr  Procedure(s) Performed: COLONOSCOPY WITH BIOPSY (N/A ) POLYPECTOMY (N/A Rectum)  Patient location during evaluation: PACU Anesthesia Type: General Level of consciousness: awake Pain management: pain level controlled Vital Signs Assessment: post-procedure vital signs reviewed and stable Respiratory status: spontaneous breathing Cardiovascular status: blood pressure returned to baseline Postop Assessment: no headache Anesthetic complications: no    Lavonna Monarch

## 2018-06-11 NOTE — Op Note (Signed)
[facility] Gastroenterology Patient Name: Brenda Carr Procedure Date: 06/11/2018 8:19 AM MRN: [id number] Account #: [id number] Date of Birth: Jul 10, 1956 Admit Type: Outpatient Age: 62 Room: [facility] OR ROOM 01 Gender: Female Note Status: Finalized Procedure:            Colonoscopy Indications:          Screening for colorectal malignant neoplasm Providers:            Lucilla Lame MD, MD Referring MD:         Deborra Medina, MD (Referring MD) Medicines:            Propofol per Anesthesia Complications:        No immediate complications. Procedure:            Pre-Anesthesia Assessment:                       - Prior to the procedure, a History and Physical was                        performed, and patient medications and allergies were                        reviewed. The patient's tolerance of previous                        anesthesia was also reviewed. The risks and benefits of                        the procedure and the sedation options and risks were                        discussed with the patient. All questions were                        answered, and informed consent was obtained. Prior                        Anticoagulants: The patient has taken no previous                        anticoagulant or antiplatelet agents. ASA Grade                        Assessment: II - A patient with mild systemic disease.                        After reviewing the risks and benefits, the patient was                        deemed in satisfactory condition to undergo the                        procedure.                       After obtaining informed consent, the colonoscope was                        passed under direct vision. Throughout the procedure,  the patient's blood pressure, pulse, and oxygen                        saturations were monitored continuously. The was                        introduced through the anus and advanced to the the                 cecum, identified by appendiceal orifice and ileocecal                        valve. The colonoscopy was performed without                        difficulty. The patient tolerated the procedure well.                        The quality of the bowel preparation was excellent. Findings:      The perianal and digital rectal examinations were normal.      A 8 mm polyp was found in the ascending colon. The polyp was sessile.       The polyp was removed with a hot snare. Resection and retrieval were       complete. To prevent bleeding post-intervention, two hemostatic clips       were successfully placed (MR conditional). There was no bleeding at the       end of the procedure.      The exam was otherwise without abnormality. Impression:           - One 8 mm polyp in the ascending colon, removed with a                        hot snare. Resected and retrieved. Clips (MR                        conditional) were placed.                       - The examination was otherwise normal. Recommendation:       - Discharge patient to home.                       - Resume previous diet.                       - Continue present medications.                       - Await pathology results.                       - Repeat colonoscopy in 5 years if polyp adenoma and 10                        years if hyperplastic Procedure Code(s):    --- Professional ---                       [phone], Colonoscopy, flexible; with removal of tumor(s),  polyp(s), or other lesion(s) by snare technique Diagnosis Code(s):    --- Professional ---                       Z12.11, Encounter for screening for malignant neoplasm                        of colon                       D12.2, Benign neoplasm of ascending colon CPT copyright 2018 American Medical Association. All rights reserved. The codes documented in this report are preliminary and upon coder review may  be revised to meet current compliance  requirements. Lucilla Lame MD, MD 06/11/2018 9:04:03 AM This report has been signed electronically. Number of Addenda: 0 Note Initiated On: 06/11/2018 8:19 AM Scope Withdrawal Time: 0 hours 11 minutes 10 seconds  Total Procedure Duration: 0 hours 15 minutes 48 seconds       [facility]

## 2018-06-11 NOTE — Anesthesia Preprocedure Evaluation (Signed)
Anesthesia Evaluation  Patient identified by MRN, date of birth, ID band Patient awake    Reviewed: Allergy & Precautions, NPO status , Patient's Chart, lab work & pertinent test results, reviewed documented beta blocker date and time   Airway Mallampati: II  TM Distance: >3 FB Neck ROM: Full    Dental no notable dental hx.    Pulmonary neg pulmonary ROS,    Pulmonary exam normal breath sounds clear to auscultation       Cardiovascular hypertension, Normal cardiovascular exam Rhythm:Regular Rate:Normal     Neuro/Psych negative neurological ROS  negative psych ROS   GI/Hepatic GERD  ,  Endo/Other  Hypothyroidism   Renal/GU   negative genitourinary   Musculoskeletal negative musculoskeletal ROS (+)   Abdominal (+) + obese,   Peds negative pediatric ROS (+)  Hematology negative hematology ROS (+)   Anesthesia Other Findings   Reproductive/Obstetrics negative OB ROS                             Anesthesia Physical Anesthesia Plan  ASA: II  Anesthesia Plan: General   Post-op Pain Management:    Induction: Intravenous  PONV Risk Score and Plan:   Airway Management Planned: Natural Airway  Additional Equipment: None  Intra-op Plan:   Post-operative Plan:   Informed Consent: I have reviewed the patients History and Physical, chart, labs and discussed the procedure including the risks, benefits and alternatives for the proposed anesthesia with the patient or authorized representative who has indicated his/her understanding and acceptance.     Plan Discussed with: CRNA, Anesthesiologist and Surgeon  Anesthesia Plan Comments:         Anesthesia Quick Evaluation

## 2018-06-12 ENCOUNTER — Encounter: Payer: Self-pay | Admitting: Gastroenterology

## 2018-06-13 ENCOUNTER — Encounter: Payer: Self-pay | Admitting: Gastroenterology

## 2018-06-13 ENCOUNTER — Ambulatory Visit: Payer: Self-pay | Attending: Oncology | Admitting: *Deleted

## 2018-06-13 ENCOUNTER — Other Ambulatory Visit: Payer: Self-pay

## 2018-06-13 ENCOUNTER — Ambulatory Visit
Admission: RE | Admit: 2018-06-13 | Discharge: 2018-06-13 | Disposition: A | Discharge status: Home / Self Care | Payer: Self-pay | Source: Ambulatory Visit | Attending: Oncology | Admitting: Oncology

## 2018-06-13 ENCOUNTER — Encounter: Payer: Self-pay | Admitting: *Deleted

## 2018-06-13 VITALS — BP 143/83 | HR 83 | Temp 98.4°F | Ht 73.0 in | Wt 222.0 lb

## 2018-06-13 DIAGNOSIS — Z0000 Encounter for general adult medical examination without abnormal findings: Secondary | ICD-10-CM | POA: Insufficient documentation

## 2018-06-13 NOTE — Patient Instructions (Signed)
Gave patient hand-out, Women Staying Healthy, Active and Well from [facility], with education on breast health, pap smears, heart and colon health. 

## 2018-06-13 NOTE — Progress Notes (Signed)
  Subjective:     Patient ID: Brenda Carr, female   DOB: July 31, 1956, 62 y.o.   MRN: [id number]  HPI   Review of Systems     Objective:   Physical Exam  Pulmonary/Chest: Right breast exhibits no inverted nipple, no mass, no nipple discharge, no skin change and no tenderness. Left breast exhibits no inverted nipple, no mass, no nipple discharge, no skin change and no tenderness.  Abdominal: There is no splenomegaly or hepatomegaly.    Genitourinary: There is no rash, tenderness, lesion or injury on the right labia. There is no rash, tenderness, lesion or injury on the left labia. Uterus is not deviated and not enlarged. Cervix exhibits no motion tenderness, no discharge and no friability. Right adnexum displays no mass, no tenderness and no fullness. Left adnexum displays no mass, no tenderness and no fullness. No erythema, tenderness or bleeding in the vagina. No foreign body in the vagina. No signs of injury around the vagina. No vaginal discharge found.  Genitourinary Comments: Cystocele noted       Assessment:     34 year White female presents to [facility] for clinical breast exam, pap and mammogram.  Patient is a former Personal assistant.  [place]  Clinical breast exam unremarkable.  Taught self breast awareness.  Specimen collected for pap smear without difficulty.  Patient has been screened for eligibility.  She does not have any insurance, Medicare or Medicaid.  She also meets financial eligibility.  Hand-out given on the Affordable Care Act.  Risk Assessment    Risk Scores      06/13/2018   Last edited by: Theodore Demark, RN   5-year risk: 2.4 %   Lifetime risk: 10 %             Plan:     Screening mammogram ordered.  Specimen for pap sent to the lab.  Will follow up per [facility] protocol.

## 2018-06-16 LAB — PAP LB AND HPV HIGH-RISK: HPV, high-risk: NEGATIVE

## 2018-06-18 ENCOUNTER — Encounter: Payer: Self-pay | Admitting: *Deleted

## 2018-06-18 NOTE — Progress Notes (Signed)
Letter mailed to inform patient of her normal mammogram and pap smear.  Next mammo in 1 year and pap smear in 5 years.  HSIS to [place].

## 2018-09-05 ENCOUNTER — Encounter: Payer: Self-pay | Admitting: Internal Medicine

## 2018-10-01 ENCOUNTER — Other Ambulatory Visit: Payer: Self-pay | Admitting: Internal Medicine

## 2018-10-10 ENCOUNTER — Encounter: Payer: Self-pay | Admitting: Internal Medicine

## 2018-10-22 ENCOUNTER — Encounter: Payer: Self-pay | Admitting: Internal Medicine

## 2018-10-24 ENCOUNTER — Encounter: Payer: Self-pay | Admitting: Internal Medicine

## 2018-12-03 ENCOUNTER — Encounter: Payer: Self-pay | Admitting: Internal Medicine

## 2018-12-10 ENCOUNTER — Encounter: Payer: Self-pay | Admitting: Internal Medicine

## 2019-01-18 ENCOUNTER — Ambulatory Visit (INDEPENDENT_AMBULATORY_CARE_PROVIDER_SITE_OTHER): Payer: Self-pay | Admitting: Internal Medicine

## 2019-01-18 ENCOUNTER — Encounter: Payer: Self-pay | Admitting: Internal Medicine

## 2019-01-18 ENCOUNTER — Other Ambulatory Visit: Payer: Self-pay

## 2019-01-18 DIAGNOSIS — L989 Disorder of the skin and subcutaneous tissue, unspecified: Secondary | ICD-10-CM

## 2019-01-18 DIAGNOSIS — L98491 Non-pressure chronic ulcer of skin of other sites limited to breakdown of skin: Secondary | ICD-10-CM

## 2019-01-18 DIAGNOSIS — I10 Essential (primary) hypertension: Secondary | ICD-10-CM

## 2019-01-18 DIAGNOSIS — E890 Postprocedural hypothyroidism: Secondary | ICD-10-CM

## 2019-01-18 NOTE — Telephone Encounter (Signed)
NEEDS A 15 MINUTE VIRTUAL VISIT TO ACCOMPANY THE PHOTO SHE SENT REQUESTING A DERMATOLOGY REFERRAL . YOU CAN ADD IT TO [facility] IF YOU CAN REACH HER.

## 2019-01-18 NOTE — Telephone Encounter (Signed)
Appt has been scheduled for 4:30pm today. Pt is aware.

## 2019-01-18 NOTE — Progress Notes (Signed)
Virtual Visit via [email or web address] Note  This visit type was conducted due to national recommendations for restrictions regarding the COVID-19 pandemic (e.g. social distancing).  This format is felt to be most appropriate for this patient at this time.  All issues noted in this document were discussed and addressed.  No physical exam was performed (except for noted visual exam findings with Video Visits).   I connected with@ on 01/20/19 at  4:30 PM EDT by a video enabled telemedicine application  and verified that I am speaking with the correct person using two identifiers. Location patient: home Location provider: work or home office Persons participating in the virtual visit: patient, provider  I discussed the limitations, risks, security and privacy concerns of performing an evaluation and management service by telephone and the availability of in person appointments. I also discussed with the patient that there may be a patient responsible charge related to this service. The patient expressed understanding and agreed to proceed.  Reason for visit: enlarging scaly lesion on leg   HPI: 63 yr old female with history of Graves Disease,  Postablative hypothyroidism, essential  hypertension, onychomycosis  treated with terbinafine in 2019 presents with a   2 to 3 month history of a scaling silvery patch of broken skin " on my leg that has not healed. It sometimes seems to heal, but then flakes and sometimes bleeds. It is also itchy at times. It continues to get larger."  She has no history of a wound in that area.  Has no history of repetitive sun exposure.  No recent contact with irritating plants.  No new medications, pets or moisturizers   She has been applying neosporin on it with no improvement.  Her Husband has psoriasis and gave her some of his betamethasone ointment last night , and she notices that after just one treatment the patch looks less irritated .    She has an appointment with Brendolyn Patty, MD on July 13 but is worried about waiting that long and would like to  Have an  earlier appointment with any available dermatologist. .    HTN:  Has not had renal function checked despite being ordered last July and taking lisinopril.  Has annual exam set up for July      ROS: Patient denies headache, fevers, malaise, unintentional weight loss, skin rash, eye pain, sinus congestion and sinus pain, sore throat, dysphagia,  hemoptysis , cough, dyspnea, wheezing, chest pain, palpitations, orthopnea, edema, abdominal pain, nausea, melena, diarrhea, constipation, flank pain, dysuria, hematuria, urinary  Frequency, nocturia, numbness, tingling, seizures,  Focal weakness, Loss of consciousness,  Tremor, insomnia, depression, anxiety, and suicidal ideation.      Past Medical History:  Diagnosis Date  . GERD (gastroesophageal reflux disease)   . Hypertension   . Thyroid disease    graves disease. Thyroid  ablated.    Past Surgical History:  Procedure Laterality Date  . CHOLECYSTECTOMY  2006  . COLONOSCOPY WITH PROPOFOL N/A 06/11/2018   Procedure: COLONOSCOPY WITH BIOPSY;  Surgeon: Lucilla Lame, MD;  Location: [facility];  Service: Endoscopy;  Laterality: N/A;  . POLYPECTOMY N/A 06/11/2018   Procedure: POLYPECTOMY;  Surgeon: Lucilla Lame, MD;  Location: [facility];  Service: Endoscopy;  Laterality: N/A;    Family History  Problem Relation Age of Onset  . Anuerysm Mother 64       aortic   . Early death Mother   . Congestive Heart Failure Father 28  . Hearing loss Father   .  Heart disease Father   . Hearing loss Brother   . Hypertension Brother   . Cancer Maternal Grandmother 38       colon   . Early death Maternal Grandmother   . Heart disease Maternal Grandfather   . Cancer Paternal Grandmother   . Mental illness Paternal Grandfather   . Hypertension Brother   . Cancer - Colon Paternal Aunt 90       colon    SOCIAL HX:  reports that she has never  smoked. She has never used smokeless tobacco. She reports current alcohol use. She reports that she does not use drugs.   Current Outpatient Medications:  .  amLODipine (NORVASC) 5 MG tablet, TAKE 1 TABLET(5 MG) BY MOUTH DAILY, Disp: 90 tablet, Rfl: 1 .  ARMOUR THYROID 90 MG tablet, TK 1 T PO D FOR 6 DAYS OF THE WEEK AND 2 TS ON SUNDAYS UTD, Disp: 90 tablet, Rfl: 3 .  lisinopril (PRINIVIL,ZESTRIL) 40 MG tablet, TAKE 1 TABLET(40 MG) BY MOUTH DAILY, Disp: 90 tablet, Rfl: 1  EXAM:  VITALS per patient if applicable:  GENERAL: alert, oriented, appears well and in no acute distress  HEENT: atraumatic, conjunttiva clear, no obvious abnormalities on inspection of external nose and ears  NECK: normal movements of the head and neck  LUNGS: on inspection no signs of respiratory distress, breathing rate appears normal, no obvious gross SOB, gasping or wheezing  CV: no obvious cyanosis  MS: moves all visible extremities without noticeable abnormality  Skin: 6 cm silvery annular placque with areas of erosion  PSYCH/NEURO: pleasant and cooperative, no obvious depression or anxiety, speech and thought processing grossly intact  ASSESSMENT AND PLAN:  Discussed the following assessment and plan:   Hypertension She is long overdue for renal function assessment. Home readings are normal.  Labs ordered   Skin lesion of left leg Since she has already started to see an improvement with hi potency steroid,  The lesion is not likely to be squamous cell CA and more likely psoriasis.  Advised to retain the photo of the lesion taken before use of steroid cream for review by dermatologist. She has an appt with Dr Nicole Kindred on July 13 but is requesting  a referral to dermatology for an  earlier appointment if possible.     I discussed the assessment and treatment plan with the patient. The patient was provided an opportunity to ask questions and all were answered. The patient agreed with the plan and  demonstrated an understanding of the instructions.   The patient was advised to call back or seek an in-person evaluation if the symptoms worsen or if the condition fails to improve as anticipated.  I provided  25 minutes of non-face-to-face time during this encounter.   Crecencio Mc, MD

## 2019-01-20 DIAGNOSIS — L309 Dermatitis, unspecified: Secondary | ICD-10-CM | POA: Insufficient documentation

## 2019-01-20 NOTE — Assessment & Plan Note (Signed)
She is long overdue for renal function assessment. Home readings are normal.  Labs ordered

## 2019-01-20 NOTE — Assessment & Plan Note (Signed)
Since she has already started to see an improvement with hi potency steroid,  The lesion is not likely to be squamous cell CA and more likely psoriasis.  Advised to retain the photo of the lesion taken before use of steroid cream for review by dermatologist. She has an appt with Dr Nicole Kindred on July 13 but is requesting  a referral to dermatology for an  earlier appointment if possible.

## 2019-01-21 ENCOUNTER — Other Ambulatory Visit: Payer: Self-pay

## 2019-02-15 ENCOUNTER — Other Ambulatory Visit: Payer: Self-pay

## 2019-02-18 ENCOUNTER — Encounter: Payer: Self-pay | Admitting: Internal Medicine

## 2019-03-01 ENCOUNTER — Other Ambulatory Visit: Payer: Self-pay | Admitting: Internal Medicine

## 2019-03-29 ENCOUNTER — Other Ambulatory Visit: Payer: Self-pay

## 2019-03-29 ENCOUNTER — Other Ambulatory Visit (INDEPENDENT_AMBULATORY_CARE_PROVIDER_SITE_OTHER): Payer: Self-pay

## 2019-03-29 DIAGNOSIS — I10 Essential (primary) hypertension: Secondary | ICD-10-CM

## 2019-03-29 DIAGNOSIS — L98491 Non-pressure chronic ulcer of skin of other sites limited to breakdown of skin: Secondary | ICD-10-CM

## 2019-03-29 DIAGNOSIS — E890 Postprocedural hypothyroidism: Secondary | ICD-10-CM

## 2019-03-29 LAB — CBC WITH DIFFERENTIAL/PLATELET
Basophils Absolute: 0 10*3/uL (ref 0.0–0.1)
Basophils Relative: 0.7 % (ref 0.0–3.0)
Eosinophils Absolute: 0.4 10*3/uL (ref 0.0–0.7)
Eosinophils Relative: 6.6 % — ABNORMAL HIGH (ref 0.0–5.0)
HCT: 37.6 % (ref 36.0–46.0)
Hemoglobin: 12.6 g/dL (ref 12.0–15.0)
Lymphocytes Relative: 38.8 % (ref 12.0–46.0)
Lymphs Abs: 2.1 10*3/uL (ref 0.7–4.0)
MCHC: 33.6 g/dL (ref 30.0–36.0)
MCV: 88.1 fl (ref 78.0–100.0)
Monocytes Absolute: 0.5 10*3/uL (ref 0.1–1.0)
Monocytes Relative: 8.9 % (ref 3.0–12.0)
Neutro Abs: 2.4 10*3/uL (ref 1.4–7.7)
Neutrophils Relative %: 45 % (ref 43.0–77.0)
Platelets: 210 10*3/uL (ref 150.0–400.0)
RBC: 4.27 Mil/uL (ref 3.87–5.11)
RDW: 13.4 % (ref 11.5–15.5)
WBC: 5.4 10*3/uL (ref 4.0–10.5)

## 2019-03-29 LAB — LIPID PANEL
Cholesterol: 211 mg/dL — ABNORMAL HIGH (ref 0–200)
HDL: 40.9 mg/dL (ref >39.00)
LDL Cholesterol: 147 mg/dL — ABNORMAL HIGH (ref 0–99)
NonHDL: 170.57
Total CHOL/HDL Ratio: 5
Triglycerides: 117 mg/dL (ref 0.0–149.0)
VLDL: 23.4 mg/dL (ref 0.0–40.0)

## 2019-03-29 LAB — COMPREHENSIVE METABOLIC PANEL
ALT: 17 U/L (ref 0–35)
AST: 17 U/L (ref 0–37)
Albumin: 4.3 g/dL (ref 3.5–5.2)
Alkaline Phosphatase: 71 U/L (ref 39–117)
BUN: 17 mg/dL (ref 6–23)
CO2: 24 mEq/L (ref 19–32)
Calcium: 9.5 mg/dL (ref 8.4–10.5)
Chloride: 106 mEq/L (ref 96–112)
Creatinine, Ser: 0.75 mg/dL (ref 0.40–1.20)
GFR: 77.77 mL/min (ref >60.00)
Glucose, Bld: 98 mg/dL (ref 70–99)
Potassium: 4.2 mEq/L (ref 3.5–5.1)
Sodium: 138 mEq/L (ref 135–145)
Total Bilirubin: 0.9 mg/dL (ref 0.2–1.2)
Total Protein: 6.9 g/dL (ref 6.0–8.3)

## 2019-03-29 LAB — TSH: TSH: 3.63 u[IU]/mL (ref 0.35–4.50)

## 2019-04-01 ENCOUNTER — Other Ambulatory Visit: Payer: Self-pay

## 2019-04-01 ENCOUNTER — Encounter: Payer: Self-pay | Admitting: Internal Medicine

## 2019-04-01 ENCOUNTER — Ambulatory Visit (INDEPENDENT_AMBULATORY_CARE_PROVIDER_SITE_OTHER): Payer: Self-pay | Admitting: Internal Medicine

## 2019-04-01 DIAGNOSIS — L309 Dermatitis, unspecified: Secondary | ICD-10-CM

## 2019-04-01 DIAGNOSIS — R000 Tachycardia, unspecified: Secondary | ICD-10-CM

## 2019-04-01 DIAGNOSIS — E669 Obesity, unspecified: Secondary | ICD-10-CM

## 2019-04-01 DIAGNOSIS — E890 Postprocedural hypothyroidism: Secondary | ICD-10-CM

## 2019-04-01 DIAGNOSIS — E782 Mixed hyperlipidemia: Secondary | ICD-10-CM

## 2019-04-01 NOTE — Patient Instructions (Signed)
Your cholesterol is improving and not in need of treating  Let's repeat it in 6 months ( 12 months ok if too $$$)  Try the pickle juice  ( 1 ounce max) for muscle cramps  Monitor BP (goal is 130/80 or less)   Health Maintenance for Postmenopausal Women Menopause is a normal process in which your ability to get pregnant comes to an end. This process happens slowly over many months or years, usually between the ages of 23 and 66. Menopause is complete when you have missed your menstrual periods for 12 months. It is important to talk with your health care provider about some of the most common conditions that affect women after menopause (postmenopausal women). These include heart disease, cancer, and bone loss (osteoporosis). Adopting a healthy lifestyle and getting preventive care can help to promote your health and wellness. The actions you take can also lower your chances of developing some of these common conditions. What should I know about menopause? During menopause, you may get a number of symptoms, such as:  Hot flashes. These can be moderate or severe.  Night sweats.  Decrease in sex drive.  Mood swings.  Headaches.  Tiredness.  Irritability.  Memory problems.  Insomnia. Choosing to treat or not to treat these symptoms is a decision that you make with your health care provider. Do I need hormone replacement therapy?  Hormone replacement therapy is effective in treating symptoms that are caused by menopause, such as hot flashes and night sweats.  Hormone replacement carries certain risks, especially as you become older. If you are thinking about using estrogen or estrogen with progestin, discuss the benefits and risks with your health care provider. What is my risk for heart disease and stroke? The risk of heart disease, heart attack, and stroke increases as you age. One of the causes may be a change in the body's hormones during menopause. This can affect how your body  uses dietary fats, triglycerides, and cholesterol. Heart attack and stroke are medical emergencies. There are many things that you can do to help prevent heart disease and stroke. Watch your blood pressure  High blood pressure causes heart disease and increases the risk of stroke. This is more likely to develop in people who have high blood pressure readings, are of African descent, or are overweight.  Have your blood pressure checked: ? Every 3-5 years if you are 49-34 years of age. ? Every year if you are 77 years old or older. Eat a healthy diet   Eat a diet that includes plenty of vegetables, fruits, low-fat dairy products, and lean protein.  Do not eat a lot of foods that are high in solid fats, added sugars, or sodium. Get regular exercise Get regular exercise. This is one of the most important things you can do for your health. Most adults should:  Try to exercise for at least 150 minutes each week. The exercise should increase your heart rate and make you sweat (moderate-intensity exercise).  Try to do strengthening exercises at least twice each week. Do these in addition to the moderate-intensity exercise.  Spend less time sitting. Even light physical activity can be beneficial. Other tips  Work with your health care provider to achieve or maintain a healthy weight.  Do not use any products that contain nicotine or tobacco, such as cigarettes, e-cigarettes, and chewing tobacco. If you need help quitting, ask your health care provider.  Know your numbers. Ask your health care provider to check your  cholesterol and your blood sugar (glucose). Continue to have your blood tested as directed by your health care provider. Do I need screening for cancer? Depending on your health history and family history, you may need to have cancer screening at different stages of your life. This may include screening for:  Breast cancer.  Cervical cancer.  Lung cancer.  Colorectal cancer.  What is my risk for osteoporosis? After menopause, you may be at increased risk for osteoporosis. Osteoporosis is a condition in which bone destruction happens more quickly than new bone creation. To help prevent osteoporosis or the bone fractures that can happen because of osteoporosis, you may take the following actions:  If you are 80-30 years old, get at least 1,000 mg of calcium and at least 600 mg of vitamin D per day.  If you are older than age 61 but younger than age 28, get at least 1,200 mg of calcium and at least 600 mg of vitamin D per day.  If you are older than age 13, get at least 1,200 mg of calcium and at least 800 mg of vitamin D per day. Smoking and drinking excessive alcohol increase the risk of osteoporosis. Eat foods that are rich in calcium and vitamin D, and do weight-bearing exercises several times each week as directed by your health care provider. How does menopause affect my mental health? Depression may occur at any age, but it is more common as you become older. Common symptoms of depression include:  Low or sad mood.  Changes in sleep patterns.  Changes in appetite or eating patterns.  Feeling an overall lack of motivation or enjoyment of activities that you previously enjoyed.  Frequent crying spells. Talk with your health care provider if you think that you are experiencing depression. General instructions See your health care provider for regular wellness exams and vaccines. This may include:  Scheduling regular health, dental, and eye exams.  Getting and maintaining your vaccines. These include: ? Influenza vaccine. Get this vaccine each year before the flu season begins. ? Pneumonia vaccine. ? Shingles vaccine. ? Tetanus, diphtheria, and pertussis (Tdap) booster vaccine. Your health care provider may also recommend other immunizations. Tell your health care provider if you have ever been abused or do not feel safe at home. Summary  Menopause is  a normal process in which your ability to get pregnant comes to an end.  This condition causes hot flashes, night sweats, decreased interest in sex, mood swings, headaches, or lack of sleep.  Treatment for this condition may include hormone replacement therapy.  Take actions to keep yourself healthy, including exercising regularly, eating a healthy diet, watching your weight, and checking your blood pressure and blood sugar levels.  Get screened for cancer and depression. Make sure that you are up to date with all your vaccines. This information is not intended to replace advice given to you by your health care provider. Make sure you discuss any questions you have with your health care provider. Document Released: 09/16/2005 Document Revised: 07/18/2018 Document Reviewed: 07/18/2018 Elsevier Patient Education  2020 [employer].

## 2019-04-01 NOTE — Progress Notes (Addendum)
Patient ID: Brenda Carr, female    DOB: October 15, 1955  Age: 63 y.o. MRN: [id number]  The patient is here for follow up and management of other chronic and acute problems.  Mammogram,  breast exam, and PAP smear  done Nov 2019 Colonoscopy 2019:  TA's,  follow up 5 yrs  Donated blood 3 weeks ago . covid 19 test was negative    The risk factors are reflected in the social history.  The roster of all physicians providing medical care to patient - is listed in the Snapshot section of the chart.  Activities of daily living:  The patient is 100% independent in all ADLs: dressing, toileting, feeding as well as independent mobility  Home safety : The patient has smoke detectors in the home. They wear seatbelts.  There are no firearms at home. There is no violence in the home.   There is no risks for hepatitis, STDs or HIV. There is no   history of blood transfusion. They have no travel history to infectious disease endemic areas of the world.  The patient has seen their dentist in the last six month. They have seen their eye doctor in the last year. They admit to slight hearing difficulty with regard to whispered voices and some television programs.  They have deferred audiologic testing in the last year.  They do not  have excessive sun exposure. Discussed the need for sun protection: hats, long sleeves and use of sunscreen if there is significant sun exposure.   Diet: the importance of a healthy diet is discussed. They do have a healthy diet.  The benefits of regular aerobic exercise were discussed. She walks 4 times per week ,  20 minutes.   Depression screen: there are no signs or vegative symptoms of depression- irritability, change in appetite, anhedonia, sadness/tearfullness.  Cognitive assessment: the patient manages all their financial and personal affairs and is actively engaged. They could relate day,date,year and events; recalled 2/3 objects at 3 minutes; performed clock-face test  normally.  The following portions of the patient's history were reviewed and updated as appropriate: allergies, current medications, past family history, past medical history,  past surgical history, past social history  and problem list.  Visual acuity was not assessed per patient preference since she has regular follow up with her ophthalmologist. Hearing and body mass index were assessed and reviewed.   During the course of the visit the patient was educated and counseled about appropriate screening and preventive services including : fall prevention , diabetes screening, nutrition counseling, colorectal cancer screening, and recommended immunizations.    CC: Diagnoses of Obesity (BMI 30-39.9), Postablative hypothyroidism, Eczema of lower extremity, Moderate mixed hyperlipidemia not requiring statin therapy, and Tachycardia were pertinent to this visit.  Overweight:   States that she intentionally lost 15 lbs pre covid 19,  But developed recurrent plantar fasciitis which is preventing her from improving her exercise regimen beyond walking .    Eczema left shin:,  Now improving with use of hi potency steroid cream prescribed by dermatology . beclomethasone bid working.  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 Does not want to see podiatry   Hypertension: patient checks blood pressure twice weekly at home.  Readings have been for the most part <130/80 at rest . Patient is following a reduce salt diet most days and  is taking medications as prescribed  History C[place] has a past medical history of GERD (gastroesophageal reflux disease), Hypertension, and Thyroid disease.   She has a past surgical history that includes Cholecystectomy (2006); Colonoscopy with propofol (N/A, 06/11/2018); and polypectomy (N/A, 06/11/2018).   Her family history  includes Anuerysm (age of onset: 42) in her mother; Cancer in her paternal grandmother; Cancer (age of onset: 58) in her maternal grandmother; Cancer - Colon (age of onset: 80) in her paternal aunt; Congestive Heart Failure (age of onset: 41) in her father; Early death in her maternal grandmother and mother; Hearing loss in her brother and father; Heart disease in her father and maternal grandfather; Hypertension in her brother and brother; Mental illness in her paternal grandfather.She reports that she has never smoked. She has never used smokeless tobacco. She reports current alcohol use. She reports that she does not use drugs.  Outpatient Medications Prior to Visit  Medication Sig Dispense Refill  . amLODipine (NORVASC) 5 MG tablet TAKE 1 TABLET(5 MG) BY MOUTH DAILY 90 tablet 1  . ARMOUR THYROID 90 MG tablet TAKE 1 TABLET BY MOUTH DAILY FOR 6 DAYS OF THE WEEK, AND 2 TABLETS ON SUN AS DIRECTED 90 tablet 3  . lisinopril (PRINIVIL,ZESTRIL) 40 MG tablet TAKE 1 TABLET(40 MG) BY MOUTH DAILY 90 tablet 1   No facility-administered medications prior to visit.     Review of Systems   Patient denies headache, fevers, malaise, unintentional weight loss, skin rash, eye pain, sinus congestion and sinus pain, sore throat, dysphagia,  hemoptysis , cough, dyspnea, wheezing, chest pain, palpitations, orthopnea, edema, abdominal pain, nausea, melena, diarrhea, constipation, flank pain, dysuria, hematuria, urinary  Frequency, nocturia, numbness, tingling, seizures,  Focal weakness, Loss of consciousness,  Tremor, insomnia, depression, anxiety, and suicidal ideation.     Objective:  BP 126/84 (BP Location: Left Arm, Patient Position: Sitting, Cuff Size: Large)   Pulse (!) 106   Temp 98.5 F (36.9 C) (Oral)   Resp 15   Ht 6\' 1"  (1.854 m)   Wt 227 lb (103 kg)   SpO2 95%   BMI 29.95 kg/m   Physical Exam   General appearance: alert, cooperative and appears stated age Ears: normal TM's and external ear  canals both ears Throat: lips, mucosa, and tongue normal; teeth and gums normal Neck: no adenopathy, no carotid bruit, supple, symmetrical, trachea midline and thyroid not enlarged, symmetric, no tenderness/mass/nodules Back: symmetric, no curvature. ROM normal. No CVA tenderness. Lungs: clear to auscultation bilaterally Heart: regular rate and rhythm, S1, S2 normal, no murmur, click, rub or gallop Abdomen: soft, non-tender; bowel sounds normal; no masses,  no organomegaly Pulses: 2+ and symmetric Skin: Skin color, texture, turgor normal. No rashes or lesions Lymph nodes: Cervical, supraclavicular, and axillary nodes normal.    Assessment & Plan:   Problem List Items Addressed This Visit      Unprioritized   Obesity (BMI 30-39.9)    I have congratulated her in reduction of   BMI and encouraged  Continued weight loss with goal of 10% of body weigh over the next 6 months using a low glycemic index diet and regular exercise a minimum of 5 days per week.        Postablative hypothyroidism    Managed with Armour thyroid due to history of intolerance to Synthroid.  Thyroid function is WNL on current dose.  No current changes needed.   Lab Results  Component Value Date   TSH 3.63 03/29/2019  Eczema of lower extremity    improving with beclomethasone ointment prescribed by dermatology      Hyperlipidemia    ased on current lipid profile, the risk of clinically significant Coronary artery disease is 13B% over the next 10 years, using the Framingham risk calculator.  Discussed the risks and benefits of statin therapy as well as the natural remedies for cholesterol.  She has already seen a drop in 15-20 pts in 3 weeks since  changing her diet.  Repeat in 6 months       Tachycardia    Exam is normal and pulse has slowed since vital signs were taken.  Discussed normal heart rate and physiology associated with pulse.          A total of 40 minutes was spent with patient more  than half of which was spent in counseling patient on the above mentioned issues , reviewing and explaining recent labs and imaging studies done, and coordination of care.  I am having Quanteria Teichman. [employer]" maintain her lisinopril, amLODipine, and Armour Thyroid.  No orders of the defined types were placed in this encounter.   There are no discontinued medications.  Follow-up: No follow-ups on file.   Crecencio Mc, MD

## 2019-04-02 ENCOUNTER — Other Ambulatory Visit: Payer: Self-pay | Admitting: Internal Medicine

## 2019-04-02 ENCOUNTER — Encounter: Payer: Self-pay | Admitting: Internal Medicine

## 2019-04-02 DIAGNOSIS — E785 Hyperlipidemia, unspecified: Secondary | ICD-10-CM | POA: Insufficient documentation

## 2019-04-02 DIAGNOSIS — R000 Tachycardia, unspecified: Secondary | ICD-10-CM | POA: Insufficient documentation

## 2019-04-02 MED ORDER — AMLODIPINE BESYLATE 5 MG PO TABS: ORAL_TABLET | ORAL | 1 refills | Status: DC

## 2019-04-02 NOTE — Telephone Encounter (Signed)
Refilled the amlodipine but not the lisinopril.

## 2019-04-02 NOTE — Assessment & Plan Note (Signed)
Managed with Armour thyroid due to history of intolerance to Synthroid.  Thyroid function is WNL on current dose.  No current changes needed.   Lab Results  Component Value Date   TSH 3.63 03/29/2019

## 2019-04-02 NOTE — Assessment & Plan Note (Signed)
improving with beclomethasone ointment prescribed by dermatology

## 2019-04-02 NOTE — Assessment & Plan Note (Signed)
I have congratulated her in reduction of   BMI and encouraged  Continued weight loss with goal of 10% of body weigh over the next 6 months using a low glycemic index diet and regular exercise a minimum of 5 days per week.    

## 2019-04-02 NOTE — Addendum Note (Signed)
Addended by: Crecencio Mc on: 04/02/2019 10:52 AM   Modules accepted: Level of Service

## 2019-04-02 NOTE — Assessment & Plan Note (Signed)
ased on current lipid profile, the risk of clinically significant Coronary artery disease is 13B% over the next 10 years, using the Framingham risk calculator.  Discussed the risks and benefits of statin therapy as well as the natural remedies for cholesterol.  She has already seen a drop in 15-20 pts in 3 weeks since  changing her diet.  Repeat in 6 months

## 2019-04-02 NOTE — Assessment & Plan Note (Signed)
Exam is normal and pulse has slowed since vital signs were taken.  Discussed normal heart rate and physiology associated with pulse.

## 2019-04-03 ENCOUNTER — Other Ambulatory Visit: Payer: Self-pay | Admitting: Internal Medicine

## 2019-04-03 MED ORDER — LOSARTAN POTASSIUM 100 MG PO TABS: 100.0000 mg | ORAL_TABLET | Freq: Every day | ORAL | 5 refills | Status: DC

## 2019-04-12 ENCOUNTER — Other Ambulatory Visit: Payer: Self-pay | Admitting: Internal Medicine

## 2019-04-12 ENCOUNTER — Other Ambulatory Visit: Payer: Self-pay

## 2019-04-12 DIAGNOSIS — Z20822 Contact with and (suspected) exposure to covid-19: Secondary | ICD-10-CM

## 2019-04-13 LAB — NOVEL CORONAVIRUS, NAA: SARS-CoV-2, NAA: NOT DETECTED

## 2019-05-21 ENCOUNTER — Ambulatory Visit (INDEPENDENT_AMBULATORY_CARE_PROVIDER_SITE_OTHER): Payer: Self-pay

## 2019-05-21 ENCOUNTER — Encounter: Payer: Self-pay | Admitting: Podiatry

## 2019-05-21 ENCOUNTER — Ambulatory Visit (INDEPENDENT_AMBULATORY_CARE_PROVIDER_SITE_OTHER): Payer: Self-pay | Admitting: Podiatry

## 2019-05-21 ENCOUNTER — Other Ambulatory Visit: Payer: Self-pay

## 2019-05-21 DIAGNOSIS — M722 Plantar fascial fibromatosis: Secondary | ICD-10-CM

## 2019-05-21 MED ORDER — MELOXICAM 15 MG PO TABS: 15.0000 mg | ORAL_TABLET | Freq: Every day | ORAL | 1 refills | Status: DC

## 2019-05-26 NOTE — Progress Notes (Signed)
   Subjective: 63 y.o. female presenting today with a chief complaint of shooting pain to the right heel and arch that began about 5 months ago. She reports h/o plantar fasciitis and states the pain is worse when she first stands in the morning or when she stands after being seated for a while. She has been stretching, using ice therapy and inserts for treatment. Patient is here for further evaluation and treatment.    Past Medical History:  Diagnosis Date  . GERD (gastroesophageal reflux disease)   . Hypertension   . Thyroid disease    graves disease. Thyroid  ablated.     Objective: Physical Exam General: The patient is alert and oriented x3 in no acute distress.  Dermatology: Skin is warm, dry and supple bilateral lower extremities. Negative for open lesions or macerations bilateral.   Vascular: Dorsalis Pedis and Posterior Tibial pulses palpable bilateral.  Capillary fill time is immediate to all digits.  Neurological: Epicritic and protective threshold intact bilateral.   Musculoskeletal: Tenderness to palpation to the plantar aspect of the right heel along the plantar fascia. All other joints range of motion within normal limits bilateral. Strength 5/5 in all groups bilateral.   Radiographic exam: Normal osseous mineralization. Joint spaces preserved. No fracture/dislocation/boney destruction. No other soft tissue abnormalities or radiopaque foreign bodies.   Assessment: 1. Plantar fasciitis right  Plan of Care:  1. Patient evaluated. Xrays reviewed.   2. Injection of 0.5cc Celestone soluspan injected into the right plantar fascia  3. Rx for Medrol Dose Pack placed 4. Rx for Meloxicam ordered for patient. 5. Plantar fascial band(s) dispensed 6. Instructed patient regarding therapies and modalities at home to alleviate symptoms.  7. Return to clinic in 4 weeks.     Edrick Kins, DPM [facility]  Dr. Edrick Kins, DPM    [address], [place] [address]                Office [phone]  Fax [phone]

## 2019-06-19 ENCOUNTER — Ambulatory Visit: Payer: Self-pay

## 2019-06-27 ENCOUNTER — Other Ambulatory Visit: Payer: Self-pay

## 2019-06-27 DIAGNOSIS — Z20822 Contact with and (suspected) exposure to covid-19: Secondary | ICD-10-CM

## 2019-06-30 LAB — NOVEL CORONAVIRUS, NAA: SARS-CoV-2, NAA: NOT DETECTED

## 2019-07-23 NOTE — Progress Notes (Signed)
Prescreened patient for BCCCP eligibity using two patient identifiers.  Patient to go straight to [facility] at 2:00 on 07/24/2019.  Orders are in.  Risk Assessment    Risk Scores      07/24/2019 06/13/2018   Last edited by: Theodore Demark, RN Theodore Demark, RN   5-year risk: 2.4 % 2.4 %   Lifetime risk: 9.7 % 10 %

## 2019-07-24 ENCOUNTER — Ambulatory Visit
Admission: RE | Admit: 2019-07-24 | Discharge: 2019-07-24 | Disposition: A | Discharge status: Home / Self Care | Payer: Self-pay | Source: Ambulatory Visit | Attending: Oncology | Admitting: Oncology

## 2019-07-24 ENCOUNTER — Ambulatory Visit: Payer: Self-pay | Attending: Oncology

## 2019-07-24 ENCOUNTER — Other Ambulatory Visit: Payer: Self-pay

## 2019-07-24 DIAGNOSIS — Z0000 Encounter for general adult medical examination without abnormal findings: Secondary | ICD-10-CM

## 2019-07-26 NOTE — Progress Notes (Signed)
Letter mailed from [facility] to notify of normal mammogram results.  Patient to return in one year for annual screening.  Copy to [facility]. Risk Assessment    No risk assessment data for the current encounter   Risk Scores      07/24/2019   Last edited by: Theodore Demark, RN   5-year risk: 2.4 %   Lifetime risk: 9.7 %

## 2019-08-27 ENCOUNTER — Ambulatory Visit (INDEPENDENT_AMBULATORY_CARE_PROVIDER_SITE_OTHER): Payer: Self-pay | Admitting: Podiatry

## 2019-08-27 ENCOUNTER — Other Ambulatory Visit: Payer: Self-pay | Admitting: Podiatry

## 2019-08-27 ENCOUNTER — Other Ambulatory Visit: Payer: Self-pay

## 2019-08-27 ENCOUNTER — Encounter: Payer: Self-pay | Admitting: Podiatry

## 2019-08-27 DIAGNOSIS — M722 Plantar fascial fibromatosis: Secondary | ICD-10-CM

## 2019-08-27 MED ORDER — MELOXICAM 15 MG PO TABS: 15.0000 mg | ORAL_TABLET | Freq: Every day | ORAL | 1 refills | Status: DC

## 2019-08-27 MED ORDER — METHYLPREDNISOLONE 4 MG PO TBPK: ORAL_TABLET | ORAL | 0 refills | Status: DC

## 2019-08-29 NOTE — Progress Notes (Signed)
   Subjective: 64 y.o. female presenting today for follow up evaluation of plantar fasciitis of the right foot. She reports some continued intermittent soreness but states it is much improved. She states she tried to use the Powerstep insoles but they did not help.  She reports pain in the left heel now that started about 1-2 months ago. Walking and being on the foot increases the pain. She has been taking Meloxicam but needs a refill. Patient is here for further evaluation and treatment.   Past Medical History:  Diagnosis Date  . GERD (gastroesophageal reflux disease)   . Hypertension   . Thyroid disease    graves disease. Thyroid  ablated.     Objective: Physical Exam General: The patient is alert and oriented x3 in no acute distress.  Dermatology: Skin is warm, dry and supple bilateral lower extremities. Negative for open lesions or macerations bilateral.   Vascular: Dorsalis Pedis and Posterior Tibial pulses palpable bilateral.  Capillary fill time is immediate to all digits.  Neurological: Epicritic and protective threshold intact bilateral.   Musculoskeletal: Tenderness to palpation to the plantar aspect of the left heel along the plantar fascia. All other joints range of motion within normal limits bilateral. Strength 5/5 in all groups bilateral.   Assessment: 1. Plantar fasciitis right foot- resolved  2. Plantar fasciitis left foot   Plan of Care:  1. Patient evaluated.  2. Injection of 0.5cc Celestone soluspan injected into the left plantar fascia.  3. Rx for Medrol Dose Pak placed 4. Rx for Meloxicam ordered for patient. 5. Instructed patient regarding therapies and modalities at home to alleviate symptoms.  6. Return to clinic as needed.  Product manager at [employer]      Edrick Kins, DPM [facility]  Dr. Edrick Kins, DPM    [address], [place] [address]                 Office [phone]  Fax [phone]

## 2019-09-30 ENCOUNTER — Ambulatory Visit: Payer: Self-pay | Attending: Internal Medicine

## 2019-09-30 DIAGNOSIS — Z23 Encounter for immunization: Secondary | ICD-10-CM | POA: Insufficient documentation

## 2019-09-30 NOTE — Progress Notes (Signed)
   Covid-19 Vaccination Clinic  Name:  Brenda Carr    MRN: [id number] DOB: 27-May-1956  09/30/2019  [name] was observed post Covid-19 immunization for 15 minutes without incidence. She was provided with Vaccine Information Sheet and instruction to access the V-Safe system.   [name] was instructed to call 911 with any severe reactions post vaccine: Marland Kitchen Difficulty breathing  . Swelling of your face and throat  . A fast heartbeat  . A bad rash all over your body  . Dizziness and weakness    Immunizations Administered    Name Date Dose VIS Date Route   Moderna COVID-19 Vaccine 09/30/2019  1:25 PM 0.5 mL 07/09/2019 Intramuscular   Manufacturer: Moderna   LotGE:[id number]   [place]:[id number]

## 2019-10-07 MED ORDER — AMLODIPINE BESYLATE 5 MG PO TABS: ORAL_TABLET | ORAL | 1 refills | Status: DC

## 2019-10-11 ENCOUNTER — Other Ambulatory Visit: Payer: Self-pay

## 2019-10-11 MED ORDER — LOSARTAN POTASSIUM 100 MG PO TABS: 100.0000 mg | ORAL_TABLET | Freq: Every day | ORAL | 1 refills | Status: DC

## 2019-10-29 ENCOUNTER — Ambulatory Visit: Payer: Self-pay

## 2019-11-19 ENCOUNTER — Ambulatory Visit: Payer: Self-pay

## 2019-11-20 ENCOUNTER — Ambulatory Visit: Payer: Self-pay

## 2019-11-27 ENCOUNTER — Ambulatory Visit: Payer: Self-pay

## 2019-12-04 ENCOUNTER — Ambulatory Visit: Payer: Self-pay | Attending: Internal Medicine

## 2019-12-04 DIAGNOSIS — Z23 Encounter for immunization: Secondary | ICD-10-CM

## 2019-12-04 NOTE — Progress Notes (Signed)
   Covid-19 Vaccination Clinic  Name:  Brenda Carr    MRN: [id number] DOB: Jul 13, 1956  12/04/2019  [name] was observed post Covid-19 immunization for 15 minutes without incident. She was provided with Vaccine Information Sheet and instruction to access the V-Safe system.   [name] was instructed to call 911 with any severe reactions post vaccine: Marland Kitchen Difficulty breathing  . Swelling of face and throat  . A fast heartbeat  . A bad rash all over body  . Dizziness and weakness   Immunizations Administered    Name Date Dose VIS Date Route   Moderna COVID-19 Vaccine 12/04/2019 12:03 PM 0.5 mL 07/2019 Intramuscular   Manufacturer: Moderna   Lot: [id number]   [place]:[id number]

## 2020-01-09 ENCOUNTER — Other Ambulatory Visit: Payer: Self-pay | Admitting: Internal Medicine

## 2020-03-25 ENCOUNTER — Other Ambulatory Visit: Payer: Self-pay

## 2020-03-25 MED ORDER — LOSARTAN POTASSIUM 100 MG PO TABS: 100.0000 mg | ORAL_TABLET | Freq: Every day | ORAL | 1 refills | Status: DC

## 2020-03-25 MED ORDER — ARMOUR THYROID 90 MG PO TABS: 90.0000 mg | ORAL_TABLET | Freq: Every day | ORAL | 1 refills | Status: DC

## 2020-03-25 MED ORDER — AMLODIPINE BESYLATE 5 MG PO TABS: ORAL_TABLET | ORAL | 1 refills | Status: DC

## 2020-04-17 ENCOUNTER — Encounter: Payer: Self-pay | Admitting: Internal Medicine

## 2020-05-14 ENCOUNTER — Other Ambulatory Visit: Payer: Self-pay

## 2020-05-18 ENCOUNTER — Encounter: Payer: Self-pay | Admitting: Internal Medicine

## 2020-06-10 ENCOUNTER — Other Ambulatory Visit: Payer: Self-pay | Admitting: Internal Medicine

## 2020-06-10 DIAGNOSIS — Z1231 Encounter for screening mammogram for malignant neoplasm of breast: Secondary | ICD-10-CM

## 2020-06-22 ENCOUNTER — Other Ambulatory Visit: Payer: Self-pay

## 2020-06-26 ENCOUNTER — Encounter: Payer: Self-pay | Admitting: Internal Medicine

## 2020-07-29 ENCOUNTER — Other Ambulatory Visit: Payer: Self-pay | Admitting: Internal Medicine

## 2020-08-19 DIAGNOSIS — H35373 Puckering of macula, bilateral: Secondary | ICD-10-CM | POA: Diagnosis not present

## 2020-08-20 ENCOUNTER — Telehealth: Payer: Self-pay | Admitting: *Deleted

## 2020-08-20 DIAGNOSIS — E559 Vitamin D deficiency, unspecified: Secondary | ICD-10-CM

## 2020-08-20 DIAGNOSIS — E890 Postprocedural hypothyroidism: Secondary | ICD-10-CM

## 2020-08-20 DIAGNOSIS — I10 Essential (primary) hypertension: Secondary | ICD-10-CM

## 2020-08-20 NOTE — Telephone Encounter (Signed)
Please place future orders for lab appt.  

## 2020-08-21 ENCOUNTER — Other Ambulatory Visit: Payer: Self-pay

## 2020-08-24 ENCOUNTER — Encounter: Payer: Self-pay | Admitting: Internal Medicine

## 2020-09-02 ENCOUNTER — Encounter: Payer: Self-pay | Admitting: *Deleted

## 2020-09-11 ENCOUNTER — Other Ambulatory Visit: Payer: Self-pay

## 2020-09-16 ENCOUNTER — Encounter: Payer: Self-pay | Admitting: Internal Medicine

## 2020-10-02 ENCOUNTER — Ambulatory Visit
Admission: RE | Admit: 2020-10-02 | Discharge: 2020-10-02 | Disposition: A | Discharge status: Home / Self Care | Payer: Medicare HMO | Source: Ambulatory Visit | Attending: Internal Medicine | Admitting: Internal Medicine

## 2020-10-02 ENCOUNTER — Other Ambulatory Visit: Payer: Self-pay

## 2020-10-02 DIAGNOSIS — Z1231 Encounter for screening mammogram for malignant neoplasm of breast: Secondary | ICD-10-CM | POA: Diagnosis not present

## 2020-10-02 DIAGNOSIS — Z0100 Encounter for examination of eyes and vision without abnormal findings: Secondary | ICD-10-CM | POA: Diagnosis not present

## 2020-10-09 ENCOUNTER — Other Ambulatory Visit: Payer: Self-pay

## 2020-10-16 ENCOUNTER — Encounter: Payer: Self-pay | Admitting: Internal Medicine

## 2020-10-30 ENCOUNTER — Ambulatory Visit: Payer: Self-pay | Admitting: Podiatry

## 2020-11-06 ENCOUNTER — Encounter: Payer: Self-pay | Admitting: Podiatry

## 2020-11-06 ENCOUNTER — Ambulatory Visit: Payer: Medicare HMO

## 2020-11-23 NOTE — Progress Notes (Signed)
This encounter was created in error - please disregard.

## 2020-11-27 ENCOUNTER — Ambulatory Visit: Payer: Medicare HMO | Admitting: Podiatry

## 2020-12-08 ENCOUNTER — Other Ambulatory Visit: Payer: Self-pay

## 2020-12-10 ENCOUNTER — Encounter: Payer: Self-pay | Admitting: Internal Medicine

## 2020-12-11 ENCOUNTER — Encounter: Payer: Self-pay | Admitting: Internal Medicine

## 2021-01-05 ENCOUNTER — Other Ambulatory Visit: Payer: Self-pay

## 2021-01-08 ENCOUNTER — Encounter: Payer: Self-pay | Admitting: Internal Medicine

## 2021-01-26 ENCOUNTER — Other Ambulatory Visit: Payer: Self-pay

## 2021-01-26 MED ORDER — ARMOUR THYROID 90 MG PO TABS: 90.0000 mg | ORAL_TABLET | Freq: Every day | ORAL | 0 refills | Status: DC

## 2021-01-26 MED ORDER — AMLODIPINE BESYLATE 5 MG PO TABS: ORAL_TABLET | ORAL | 0 refills | Status: DC

## 2021-01-27 ENCOUNTER — Other Ambulatory Visit: Payer: Self-pay | Admitting: Internal Medicine

## 2021-03-19 ENCOUNTER — Other Ambulatory Visit (INDEPENDENT_AMBULATORY_CARE_PROVIDER_SITE_OTHER): Payer: Medicare HMO

## 2021-03-19 ENCOUNTER — Other Ambulatory Visit: Payer: Self-pay

## 2021-03-19 DIAGNOSIS — E559 Vitamin D deficiency, unspecified: Secondary | ICD-10-CM

## 2021-03-19 DIAGNOSIS — I10 Essential (primary) hypertension: Secondary | ICD-10-CM | POA: Diagnosis not present

## 2021-03-19 DIAGNOSIS — E890 Postprocedural hypothyroidism: Secondary | ICD-10-CM | POA: Diagnosis not present

## 2021-03-19 LAB — COMPREHENSIVE METABOLIC PANEL
ALT: 17 U/L (ref 0–35)
AST: 17 U/L (ref 0–37)
Albumin: 3.8 g/dL (ref 3.5–5.2)
Alkaline Phosphatase: 73 U/L (ref 39–117)
BUN: 18 mg/dL (ref 6–23)
CO2: 28 mEq/L (ref 19–32)
Calcium: 9.6 mg/dL (ref 8.4–10.5)
Chloride: 103 mEq/L (ref 96–112)
Creatinine, Ser: 0.82 mg/dL (ref 0.40–1.20)
GFR: 74.71 mL/min (ref >60.00)
Glucose, Bld: 85 mg/dL (ref 70–99)
Potassium: 3.7 mEq/L (ref 3.5–5.1)
Sodium: 139 mEq/L (ref 135–145)
Total Bilirubin: 1.5 mg/dL — ABNORMAL HIGH (ref 0.2–1.2)
Total Protein: 7.1 g/dL (ref 6.0–8.3)

## 2021-03-19 LAB — VITAMIN D 25 HYDROXY (VIT D DEFICIENCY, FRACTURES): VITD: 28.16 ng/mL — ABNORMAL LOW (ref 30.00–100.00)

## 2021-03-19 LAB — LIPID PANEL
Cholesterol: 211 mg/dL — ABNORMAL HIGH (ref 0–200)
HDL: 42.6 mg/dL (ref >39.00)
LDL Cholesterol: 144 mg/dL — ABNORMAL HIGH (ref 0–99)
NonHDL: 167.91
Total CHOL/HDL Ratio: 5
Triglycerides: 119 mg/dL (ref 0.0–149.0)
VLDL: 23.8 mg/dL (ref 0.0–40.0)

## 2021-03-19 LAB — TSH: TSH: 15.31 u[IU]/mL — ABNORMAL HIGH (ref 0.35–5.50)

## 2021-03-22 NOTE — Telephone Encounter (Signed)
TSH 0.35 - 5.50 uIU/mL 15.31 High    Please advise

## 2021-03-23 ENCOUNTER — Ambulatory Visit (INDEPENDENT_AMBULATORY_CARE_PROVIDER_SITE_OTHER): Payer: Medicare HMO | Admitting: Internal Medicine

## 2021-03-23 ENCOUNTER — Encounter: Payer: Self-pay | Admitting: Internal Medicine

## 2021-03-23 ENCOUNTER — Other Ambulatory Visit: Payer: Self-pay

## 2021-03-23 VITALS — BP 142/84 | HR 100 | Temp 97.2°F | Resp 15 | Ht 73.0 in | Wt 239.2 lb

## 2021-03-23 DIAGNOSIS — Z0000 Encounter for general adult medical examination without abnormal findings: Secondary | ICD-10-CM

## 2021-03-23 DIAGNOSIS — E782 Mixed hyperlipidemia: Secondary | ICD-10-CM

## 2021-03-23 DIAGNOSIS — I10 Essential (primary) hypertension: Secondary | ICD-10-CM

## 2021-03-23 DIAGNOSIS — E890 Postprocedural hypothyroidism: Secondary | ICD-10-CM

## 2021-03-23 MED ORDER — ZOSTER VAC RECOMB ADJUVANTED 50 MCG/0.5ML IM SUSR: 0.5000 mL | Freq: Once | INTRAMUSCULAR | 1 refills | Status: AC

## 2021-03-23 MED ORDER — TETANUS-DIPHTH-ACELL PERTUSSIS 5-2.5-18.5 LF-MCG/0.5 IM SUSY: 0.5000 mL | PREFILLED_SYRINGE | Freq: Once | INTRAMUSCULAR | 0 refills | Status: AC

## 2021-03-23 NOTE — Progress Notes (Signed)
Patient ID: Brenda Carr, female    DOB: 09/12/1955  Age: 65 y.o. MRN: [id number]  The patient is here for annual preventive examination and management of other chronic and acute problems.   The risk factors are reflected in the social history.  The roster of all physicians providing medical care to patient - is listed in the Snapshot section of the chart.  Activities of daily living:  The patient is 100% independent in all ADLs: dressing, toileting, feeding as well as independent mobility  Home safety : The patient has smoke detectors in the home. They wear seatbelts.  There are no firearms at home. There is no violence in the home.   There is no risks for hepatitis, STDs or HIV. There is no   history of blood transfusion. They have no travel history to infectious disease endemic areas of the world.  The patient has seen their dentist in the last six month. They have seen their eye doctor in the last year. They admit to slight hearing difficulty with regard to whispered voices and some television programs.  They have deferred audiologic testing in the last year.  They do not  have excessive sun exposure. Discussed the need for sun protection: hats, long sleeves and use of sunscreen if there is significant sun exposure.   Diet: the importance of a healthy diet is discussed. They do have a healthy diet.  The benefits of regular aerobic exercise were discussed. She walks 4 times per week ,  20 minutes.   Depression screen: there are no signs or vegative symptoms of depression- irritability, change in appetite, anhedonia, sadness/tearfullness.  Cognitive assessment: the patient manages all their financial and personal affairs and is actively engaged. They could relate day,date,year and events; recalled 2/3 objects at 3 minutes; performed clock-face test normally.  The following portions of the patient's history were reviewed and updated as appropriate: allergies, current medications, past  family history, past medical history,  past surgical history, past social history  and problem list.  Visual acuity was not assessed per patient preference since she has regular follow up with her ophthalmologist. Hearing and body mass index were assessed and reviewed.   During the course of the visit the patient was educated and counseled about appropriate screening and preventive services including : fall prevention , diabetes screening, nutrition counseling, colorectal cancer screening, and recommended immunizations.    CC: The primary encounter diagnosis was Encounter for preventive health examination. Diagnoses of Postablative hypothyroidism, Moderate mixed hyperlipidemia not requiring statin therapy, and Primary hypertension were also pertinent to this visit.  1) elevated TSH:   history of ablation due to Grave's disease in [place] 2004.  .  No supplements. Struggling to lose weight despite increased efforts increased exercise . Takes 90 mg of armour  6 days per week and 180 mg  once a week for 10 years.   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  Readings have been for the most part  < 140/80 at rest . Patient is following a reduce salt diet most days and is taking medications as prescribed   History Brenda Carr has a past medical history of GERD (gastroesophageal reflux disease), Hypertension, and Thyroid disease.   She has a past surgical history that includes Cholecystectomy (2006); Colonoscopy with propofol (N/A, 06/11/2018); and polypectomy (N/A, 06/11/2018).   Her family  history includes Anuerysm (age of onset: 55) in her mother; Cancer in her paternal grandmother; Cancer (age of onset: 53) in her maternal grandmother; Cancer - Colon (age of onset: 41) in her paternal aunt; Congestive Heart Failure (age of onset: 56) in her father; Early death in her maternal  grandmother and mother; Hearing loss in her brother and father; Heart disease in her father and maternal grandfather; Hypertension in her brother and brother; Mental illness in her paternal grandfather.She reports that she has never smoked. She has never used smokeless tobacco. She reports current alcohol use. She reports that she does not use drugs.  Outpatient Medications Prior to Visit  Medication Sig Dispense Refill   amLODipine (NORVASC) 5 MG tablet TAKE 1 TABLET EVERY DAY 90 tablet 0   ARMOUR THYROID 90 MG tablet Take 1 tablet (90 mg total) by mouth daily. 90 tablet 0   losartan (COZAAR) 100 MG tablet TAKE 1 TABLET AT BEDTIME 90 tablet 1   meloxicam (MOBIC) 15 MG tablet TAKE 1 TABLET(15 MG) BY MOUTH DAILY (Patient not taking: Reported on 03/23/2021) 90 tablet 0   methylPREDNISolone (MEDROL DOSEPAK) 4 MG TBPK tablet 6 day dose pack - take as directed (Patient not taking: Reported on 03/23/2021) 21 tablet 0   No facility-administered medications prior to visit.    Review of Systems  Patient denies headache, fevers, malaise, unintentional weight loss, skin rash, eye pain, sinus congestion and sinus pain, sore throat, dysphagia,  hemoptysis , cough, dyspnea, wheezing, chest pain, palpitations, orthopnea, edema, abdominal pain, nausea, melena, diarrhea, constipation, flank pain, dysuria, hematuria, urinary  Frequency, nocturia, numbness, tingling, seizures,  Focal weakness, Loss of consciousness,  Tremor, insomnia, depression, anxiety, and suicidal ideation.     Objective:  BP (!) 142/84 (BP Location: Left Arm, Patient Position: Sitting, Cuff Size: Large)   Pulse 100   Temp (!) 97.2 F (36.2 C) (Temporal)   Resp 15   Ht _0  (1.854 m)   Wt 239 lb 3.2 oz (108.5 kg)   SpO2 97%   BMI 31.56 kg/m   Physical Exam  General appearance: alert, cooperative and appears stated age Head: Normocephalic, without obvious abnormality, atraumatic Eyes: conjunctivae/corneas clear. PERRL, EOM's intact.  Fundi benign. Ears: normal TM's and external ear canals both ears Nose: Nares normal. Septum midline. Mucosa normal. No drainage or sinus tenderness. Throat: lips, mucosa, and tongue normal; teeth and gums normal Neck: no adenopathy, no carotid bruit, no JVD, supple, symmetrical, trachea midline and thyroid not enlarged, symmetric, no tenderness/mass/nodules Lungs: clear to auscultation bilaterally Breasts: normal appearance, no masses or tenderness Heart: regular rate and rhythm, S1, S2 normal, no murmur, click, rub or gallop Abdomen: soft, non-tender; bowel sounds normal; no masses,  no organomegaly Extremities: extremities normal, atraumatic, no cyanosis or edema Pulses: 2+ and symmetric Skin: Skin color, texture, turgor normal. No rashes or lesions Neurologic: Alert and oriented X 3, normal strength and tone. Normal symmetric reflexes. Normal coordination and gait.     Assessment & Plan:   Problem List Items Addressed This Visit       Unprioritized   Hypertension    Home readings rare lower.  Continue current doses of amlodipine 5 mg daily and 100 mg losartan      Postablative hypothyroidism    Managed with Armour thyroid due to history of intolerance to Synthroid.  Thyroid function is underactive on current dose.  No current changes needed. Will increased dose to 180 two times weekly,  90 mg all other days  Lab Results  Component Value Date   TSH 6.41 (H) 03/23/2021         Relevant Orders   Thyroid Panel With TSH (Completed)   TSH   Hyperlipidemia    Based on lst year's lipid profile, the ten year risk of clinically significant Coronary artery disease was 13% and has been reduced to 11% through careful diet t.    Lab Results  Component Value Date   CHOL 211 (H) 03/19/2021   HDL 42.60 03/19/2021   LDLCALC 144 (H) 03/19/2021   TRIG 119.0 03/19/2021   CHOLHDL 5 03/19/2021         Encounter for preventive health examination - Primary    age appropriate education  and counseling updated, referrals for preventative services and immunizations addressed, dietary and smoking counseling addressed, most recent labs reviewed.  I have personally reviewed and have noted:   1) the patient's medical and social history 2) The pt's use of alcohol, tobacco, and illicit drugs 3) The patient's current medications and supplements 4) Functional ability including ADL's, fall risk, home safety risk, hearing and visual impairment 5) Diet and physical activities 6) Evidence for depression or mood disorder 7) The patient's height, weight, and BMI have been recorded in the chart   I have made referrals, and provided counseling and education based on review of the above        I have discontinued Lindey Renzulli. Strojny "Marty"'s methylPREDNISolone and meloxicam. I am also having her start on Tdap and Zoster Vaccine Adjuvanted. Additionally, I am having her maintain her Armour Thyroid, amLODipine, and losartan.  Meds ordered this encounter  Medications   Tdap (BOOSTRIX) 5-2.5-18.5 LF-MCG/0.5 injection    Sig: Inject 0.5 mLs into the muscle once for 1 dose.    Dispense:  0.5 mL    Refill:  0   Zoster Vaccine Adjuvanted [facility]) injection    Sig: Inject 0.5 mLs into the muscle once for 1 dose.    Dispense:  1 each    Refill:  1    Medications Discontinued During This Encounter  Medication Reason   meloxicam (MOBIC) 15 MG tablet    methylPREDNISolone (MEDROL DOSEPAK) 4 MG TBPK tablet     Follow-up: No follow-ups on file.   Crecencio Mc, MD

## 2021-03-24 DIAGNOSIS — Z0000 Encounter for general adult medical examination without abnormal findings: Secondary | ICD-10-CM | POA: Insufficient documentation

## 2021-03-24 LAB — THYROID PANEL WITH TSH
Free Thyroxine Index: 1.7 (ref 1.4–3.8)
T3 Uptake: 30 % (ref 22–35)
T4, Total: 5.6 ug/dL (ref 5.1–11.9)
TSH: 6.41 mIU/L — ABNORMAL HIGH (ref 0.40–4.50)

## 2021-03-24 NOTE — Assessment & Plan Note (Signed)

## 2021-03-24 NOTE — Assessment & Plan Note (Signed)
Home readings rare lower.  Continue current doses of amlodipine 5 mg daily and 100 mg losartan

## 2021-03-24 NOTE — Assessment & Plan Note (Addendum)
Managed with Armour thyroid due to history of intolerance to Synthroid.  Thyroid function is underactive on current dose.  No current changes needed. Will increased dose to 180 two times weekly,  90 mg all other days   Lab Results  Component Value Date   TSH 6.41 (H) 03/23/2021

## 2021-03-24 NOTE — Assessment & Plan Note (Addendum)
Based on lst year's lipid profile, the ten year risk of clinically significant Coronary artery disease was 13% and has been reduced to 11% through careful diet t.    Lab Results  Component Value Date   CHOL 211 (H) 03/19/2021   HDL 42.60 03/19/2021   LDLCALC 144 (H) 03/19/2021   TRIG 119.0 03/19/2021   CHOLHDL 5 03/19/2021

## 2021-04-15 ENCOUNTER — Other Ambulatory Visit: Payer: Self-pay | Admitting: Internal Medicine

## 2021-04-20 ENCOUNTER — Other Ambulatory Visit: Payer: Self-pay | Admitting: Internal Medicine

## 2021-04-26 ENCOUNTER — Other Ambulatory Visit: Payer: Self-pay

## 2021-04-26 ENCOUNTER — Other Ambulatory Visit (INDEPENDENT_AMBULATORY_CARE_PROVIDER_SITE_OTHER): Payer: Medicare HMO

## 2021-04-26 DIAGNOSIS — E890 Postprocedural hypothyroidism: Secondary | ICD-10-CM

## 2021-04-26 LAB — TSH: TSH: 1.77 u[IU]/mL (ref 0.35–5.50)

## 2021-05-13 DIAGNOSIS — H02831 Dermatochalasis of right upper eyelid: Secondary | ICD-10-CM | POA: Diagnosis not present

## 2021-05-13 DIAGNOSIS — H02834 Dermatochalasis of left upper eyelid: Secondary | ICD-10-CM | POA: Diagnosis not present

## 2021-08-31 ENCOUNTER — Other Ambulatory Visit: Payer: Self-pay | Admitting: Internal Medicine

## 2021-08-31 DIAGNOSIS — Z1231 Encounter for screening mammogram for malignant neoplasm of breast: Secondary | ICD-10-CM

## 2021-09-13 ENCOUNTER — Telehealth: Payer: Self-pay | Admitting: Internal Medicine

## 2021-09-13 MED ORDER — ARMOUR THYROID 90 MG PO TABS: 90.0000 mg | ORAL_TABLET | Freq: Every day | ORAL | 0 refills | Status: DC

## 2021-09-13 NOTE — Telephone Encounter (Signed)
30 day supply has been sent to pharmacy.  

## 2021-09-13 NOTE — Telephone Encounter (Signed)
Patient need a 30 day supply of Armour Thyroid 90 MG to be sent [facility] on [address], [place] [facility] # [phone] or [phone]

## 2021-09-14 ENCOUNTER — Other Ambulatory Visit: Payer: Self-pay

## 2021-09-14 MED ORDER — ARMOUR THYROID 90 MG PO TABS: 90.0000 mg | ORAL_TABLET | Freq: Every day | ORAL | 0 refills | Status: DC

## 2021-09-14 MED ORDER — LOSARTAN POTASSIUM 100 MG PO TABS: 100.0000 mg | ORAL_TABLET | Freq: Every day | ORAL | 1 refills | Status: DC

## 2021-09-16 ENCOUNTER — Telehealth: Payer: Self-pay | Admitting: Internal Medicine

## 2021-09-16 MED ORDER — LOSARTAN POTASSIUM 100 MG PO TABS: 100.0000 mg | ORAL_TABLET | Freq: Every day | ORAL | 1 refills | Status: DC

## 2021-09-16 MED ORDER — AMLODIPINE BESYLATE 5 MG PO TABS: 5.0000 mg | ORAL_TABLET | Freq: Every day | ORAL | 0 refills | Status: DC

## 2021-09-16 MED ORDER — ARMOUR THYROID 90 MG PO TABS: 90.0000 mg | ORAL_TABLET | Freq: Every day | ORAL | 0 refills | Status: DC

## 2021-09-16 NOTE — Telephone Encounter (Signed)
Medications have been sent to new pharmacy. Pharmacy has been updated in chart.

## 2021-09-16 NOTE — Telephone Encounter (Signed)
Pt has changed insurance to [employer] and she want her medication sent to [facility] in order. Pt stated she is good on medication right now

## 2021-09-24 ENCOUNTER — Ambulatory Visit
Admission: EM | Admit: 2021-09-24 | Discharge: 2021-09-24 | Disposition: A | Discharge status: Home / Self Care | Payer: No Typology Code available for payment source | Attending: Emergency Medicine | Admitting: Emergency Medicine

## 2021-09-24 ENCOUNTER — Encounter: Payer: Self-pay | Admitting: Emergency Medicine

## 2021-09-24 ENCOUNTER — Other Ambulatory Visit: Payer: Self-pay

## 2021-09-24 DIAGNOSIS — J069 Acute upper respiratory infection, unspecified: Secondary | ICD-10-CM

## 2021-09-24 MED ORDER — AZITHROMYCIN 250 MG PO TABS: 250.0000 mg | ORAL_TABLET | Freq: Every day | ORAL | 0 refills | Status: DC

## 2021-09-24 MED ORDER — PREDNISONE 20 MG PO TABS: 40.0000 mg | ORAL_TABLET | Freq: Every day | ORAL | 0 refills | Status: DC

## 2021-09-24 NOTE — ED Triage Notes (Signed)
Pt c/o cough, chest congestion, sore throat, nasal congestion. Started about a week ago. Denies fever.

## 2021-09-24 NOTE — ED Provider Notes (Signed)
[facility]    CSN: [id number] Arrival date & time: 09/24/21  1456      History   Chief Complaint Chief Complaint  Patient presents with   Cough    HPI Brenda Carr is a 66 y.o. female.   Patient presents with nasal congestion, rhinorrhea, sinus pressure, sore throat, nonproductive cough, intermittent shortness of breath with exertion and a sinus headache for 7 days.  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  Has attempted use of Coricidin which she did not tolerate and ibuprofen which was not helpful.  Tolerating food and liquids.  History of GERD, hypertension, Graves' disease.    Past Medical History:  Diagnosis Date   GERD (gastroesophageal reflux disease)    Hypertension    Thyroid disease    graves disease. Thyroid  ablated.    Patient Active Problem List   Diagnosis Date Noted   Encounter for preventive health examination 03/24/2021   Hyperlipidemia 04/02/2019   Tachycardia 04/02/2019   Eczema of lower extremity 01/20/2019   Special screening for malignant neoplasms, colon    Benign neoplasm of ascending colon    Obesity (BMI 30-39.9) 11/20/2017   Hypertension 11/20/2017   Postablative hypothyroidism 03/14/2014    Past Surgical History:  Procedure Laterality Date   CHOLECYSTECTOMY  2006   COLONOSCOPY WITH PROPOFOL N/A 06/11/2018   Procedure: COLONOSCOPY WITH BIOPSY;  Surgeon: Lucilla Lame, MD;  Location: [facility];  Service: Endoscopy;  Laterality: N/A;   POLYPECTOMY N/A 06/11/2018   Procedure: POLYPECTOMY;  Surgeon: Lucilla Lame, MD;  Location: [facility];  Service: Endoscopy;  Laterality: N/A;    OB History   No obstetric history on file.      Home Medications    Prior to Admission medications   Medication Sig Start Date End Date Taking? Authorizing Provider  amLODipine (NORVASC) 5 MG tablet Take 1 tablet (5 mg total) by mouth daily. 09/16/21  Yes Crecencio Mc, MD  ARMOUR THYROID 90 MG tablet Take 1  tablet (90 mg total) by mouth daily. 09/16/21  Yes Crecencio Mc, MD  losartan (COZAAR) 100 MG tablet Take 1 tablet (100 mg total) by mouth at bedtime. 09/16/21  Yes Crecencio Mc, MD    Family History Family History  Problem Relation Age of Onset   Anuerysm Mother 45       aortic    Early death Mother    Congestive Heart Failure Father 25   Hearing loss Father    Heart disease Father    Hearing loss Brother    Hypertension Brother    Cancer Maternal Grandmother 99       colon    Early death Maternal Grandmother    Heart disease Maternal Grandfather    Cancer Paternal Grandmother    Mental illness Paternal Grandfather    Hypertension Brother    Cancer - Colon Paternal Aunt 90       colon    Social History Social History   Tobacco Use   Smoking status: Never   Smokeless tobacco: Never  Vaping Use   Vaping Use: Never used  Substance Use Topics   Alcohol use: Yes    Comment: 3-4x/yr   Drug use: Never     Allergies   Lamisil [terbinafine hcl]   Review of Systems Review of Systems  Constitutional: Negative.   HENT:  Positive for congestion, rhinorrhea, sinus pressure and sore throat. Negative for dental problem, drooling, ear discharge, ear pain,  facial swelling, hearing loss, mouth sores, nosebleeds, postnasal drip, sinus pain, sneezing, tinnitus, trouble swallowing and voice change.   Respiratory:  Positive for cough and shortness of breath. Negative for apnea, choking, chest tightness, wheezing and stridor.   Cardiovascular: Negative.   Gastrointestinal: Negative.   Musculoskeletal:  Positive for myalgias. Negative for arthralgias, back pain, gait problem, joint swelling, neck pain and neck stiffness.  Skin: Negative.   Neurological:  Positive for headaches. Negative for dizziness, tremors, seizures, syncope, facial asymmetry, speech difficulty, weakness, light-headedness and numbness.    Physical Exam Triage Vital Signs ED Triage Vitals  Enc Vitals Group      BP 09/24/21 1555 135/87     Pulse Rate 09/24/21 1555 92     Resp 09/24/21 1555 18     Temp 09/24/21 1555 98.6 F (37 C)     Temp Source 09/24/21 1555 Oral     SpO2 09/24/21 1555 97 %     Weight 09/24/21 1554 239 lb 3.2 oz (108.5 kg)     Height 09/24/21 1554 6\' 1"  (1.854 m)     Head Circumference --      Peak Flow --      Pain Score 09/24/21 1553 5     Pain Loc --      Pain Edu? --      Excl. in [place]? --    No data found.  Updated Vital Signs BP 135/87 (BP Location: Right Arm)    Pulse 92    Temp 98.6 F (37 C) (Oral)    Resp 18    Ht 6\' 1"  (1.854 m)    Wt 239 lb 3.2 oz (108.5 kg)    SpO2 97%    BMI 31.56 kg/m   Visual Acuity Right Eye Distance:   Left Eye Distance:   Bilateral Distance:    Right Eye Near:   Left Eye Near:    Bilateral Near:     Physical Exam Constitutional:      Appearance: Normal appearance.  HENT:     Head: Normocephalic.     Right Ear: Tympanic membrane, ear canal and external ear normal.     Left Ear: Tympanic membrane, ear canal and external ear normal.     Nose: Congestion present. No rhinorrhea.     Mouth/Throat:     Mouth: Mucous membranes are moist.     Pharynx: Oropharynx is clear.  Eyes:     Extraocular Movements: Extraocular movements intact.  Cardiovascular:     Rate and Rhythm: Normal rate and regular rhythm.     Pulses: Normal pulses.     Heart sounds: Normal heart sounds.  Pulmonary:     Effort: Pulmonary effort is normal.     Breath sounds: Normal breath sounds.     Comments: Wheezing triggered with dry cough Musculoskeletal:     Cervical back: Normal range of motion and neck supple.  Skin:    General: Skin is warm and dry.  Neurological:     Mental Status: She is alert and oriented to person, place, and time. Mental status is at baseline.  Psychiatric:        Mood and Affect: Mood normal.        Behavior: Behavior normal.     UC Treatments / Results  Labs (all labs ordered are listed, but only abnormal results are  displayed) Labs Reviewed - No data to display  EKG   Radiology No results found.  Procedures Procedures (including critical care time)  Medications Ordered in UC Medications - No data to display  Initial Impression / Assessment and Plan / UC Course  I have reviewed the triage vital signs and the nursing notes.  Pertinent labs & imaging results that were available during my care of the patient were reviewed by me and considered in my medical decision making (see chart for details).  Viral URI with cough  Vital signs are stable, O2 saturation 97% on room air, wheezing can be heard primarily with coughing, will move forward with treatment for coverage of bronchitis, Z-Pak and prednisone prescribed for treatment, declined medication for coughing, patient may take over-the-counter medications for her listed treatment as needed for additional supportive care, may follow-up with urgent care as Final Clinical Impressions(s) / UC Diagnoses   Final diagnoses:  None   Discharge Instructions   None    ED Prescriptions   None    PDMP not reviewed this encounter.   Hans Eden, NP 09/24/21 1645

## 2021-09-24 NOTE — Discharge Instructions (Signed)
Your symptoms today are most likely being caused by a virus and should steadily improve in time it can take up to 7 to 10 days before you truly start to see a turnaround however things will get better  Take azithromycin as directed on package  Starting tomorrow beginning prednisone every morning with food for 5 days     You can take Tylenol and/or Ibuprofen as needed for fever reduction and pain relief.   For cough: honey 1/2 to 1 teaspoon (you can dilute the honey in water or another fluid).  You can also use guaifenesin and dextromethorphan for cough. You can use a humidifier for chest congestion and cough.  If you don't have a humidifier, you can sit in the bathroom with the hot shower running.      For sore throat: try warm salt water gargles, cepacol lozenges, throat spray, warm tea or water with lemon/honey, popsicles or ice, or OTC cold relief medicine for throat discomfort.   For congestion: take a daily anti-histamine like Zyrtec, Claritin, and a oral decongestant, such as pseudoephedrine.  You can also use Flonase 1-2 sprays in each nostril daily.   It is important to stay hydrated: drink plenty of fluids (water, gatorade/powerade/pedialyte, juices, or teas) to keep your throat moisturized and help further relieve irritation/discomfort.

## 2021-09-27 ENCOUNTER — Telehealth: Payer: Self-pay | Admitting: Internal Medicine

## 2021-09-27 MED ORDER — ARMOUR THYROID 90 MG PO TABS: ORAL_TABLET | ORAL | 0 refills | Status: DC

## 2021-09-27 NOTE — Telephone Encounter (Signed)
noted 

## 2021-09-27 NOTE — Telephone Encounter (Signed)
I do not see where Amour Thyroid was increased to two tablets daily. Looking back at her last office note from 03/2021 there is directions to increase to 180 mg two times a week and 90 mg all other days. I clarified with the pt that is how she is taking the medication. Sent a 90 day supply to [facility].

## 2021-09-27 NOTE — Telephone Encounter (Signed)
[facility] called in stating that Pt called them to refill medication (ARMOUR THYROID 90 MG tablet). Pharmacist advise pt that its to early to refill medication. Pt advise pharmacy that she was advise by Dr. Derrel Nip to take two tablet per day. Pharmacy was calling to confirm the direction. Pharmacy would like for someone to reach out to Pt to confirm the correct direct and dosage. Pharmacy stated if any changes, script needs to be sent over with new dosage and directions. Please call pt to confirm

## 2021-09-27 NOTE — Telephone Encounter (Signed)
See previous message

## 2021-09-27 NOTE — Telephone Encounter (Signed)
[employer] want to know the dosage of what the pt take and pt need a refill on thyroid sent to [facility]   [phone] fax number

## 2021-10-06 ENCOUNTER — Encounter: Payer: Self-pay | Admitting: Internal Medicine

## 2021-10-28 IMAGING — MG MM DIGITAL SCREENING BILAT W/ TOMO AND CAD
8 series · 8 of 24 positions shown · non-contrast
Comparison: Previous exam(s).

CLINICAL DATA: Screening.

EXAM:
DIGITAL SCREENING BILATERAL MAMMOGRAM WITH TOMOSYNTHESIS AND CAD
TECHNIQUE: Bilateral screening digital craniocaudal and mediolateral oblique
mammograms were obtained. Bilateral screening digital breast
tomosynthesis was performed. The images were evaluated with
computer-aided detection.

[R MLO synth-2D]
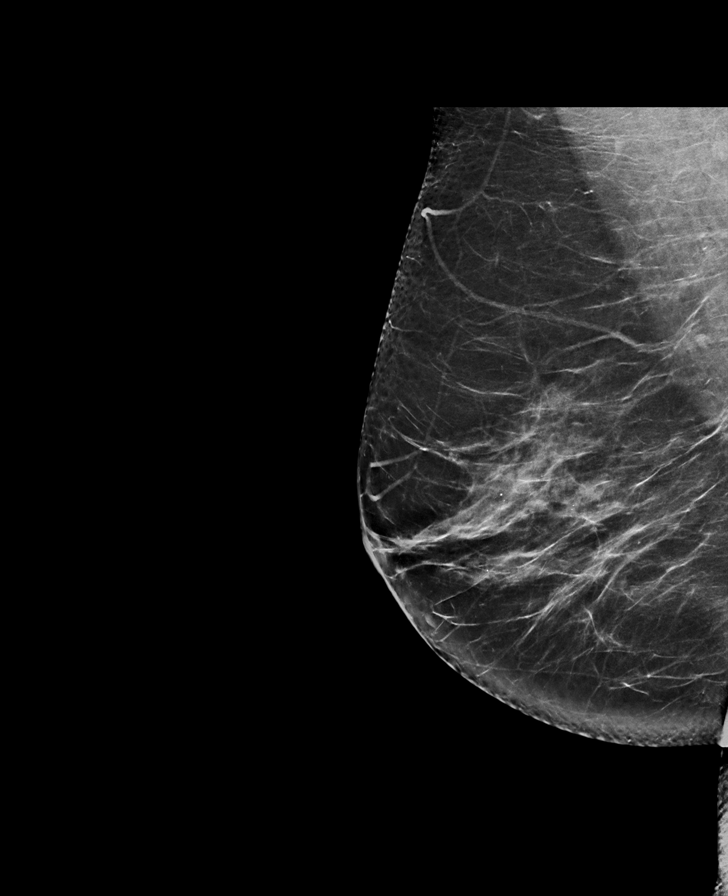

[R CC synth-2D]
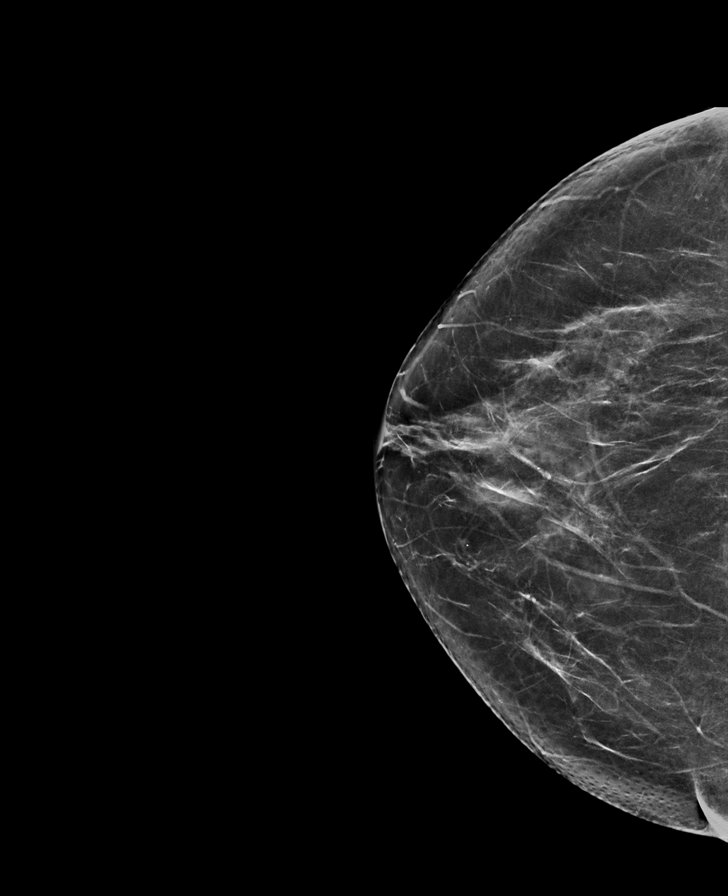

[L CC synth-2D]
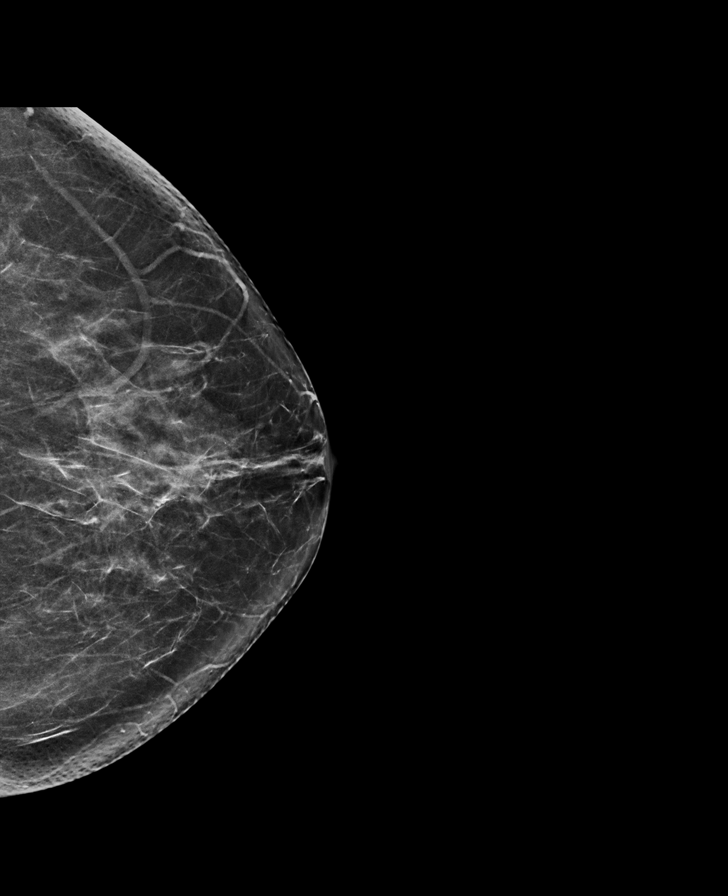

[L MLO synth-2D]
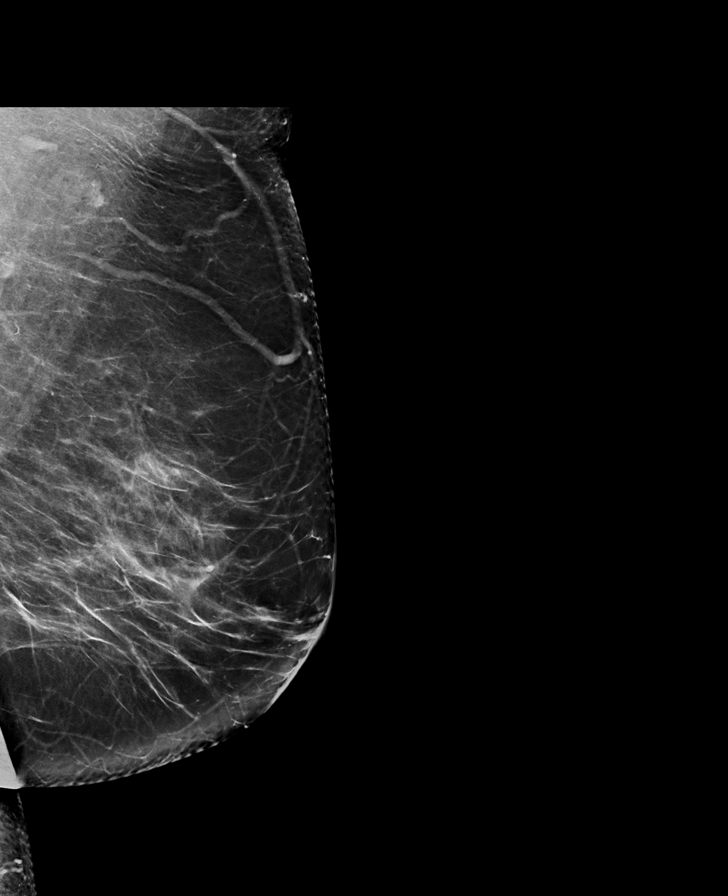

[R MLO tomo · tomo slice 41/80.0]
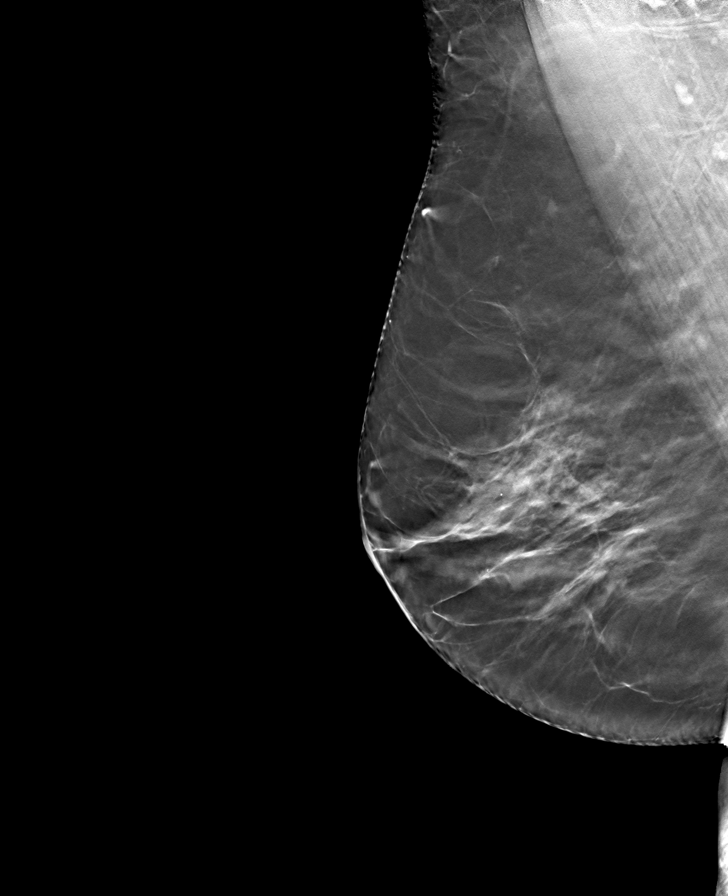

[L CC tomo · tomo slice 38/75.0]
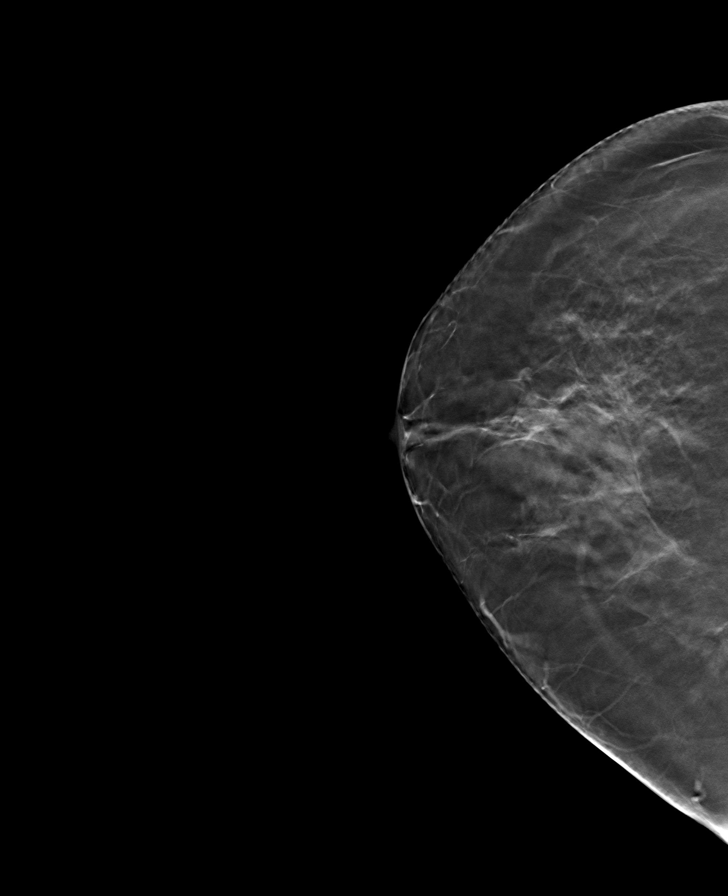

[R CC tomo · tomo slice 37/72.0]
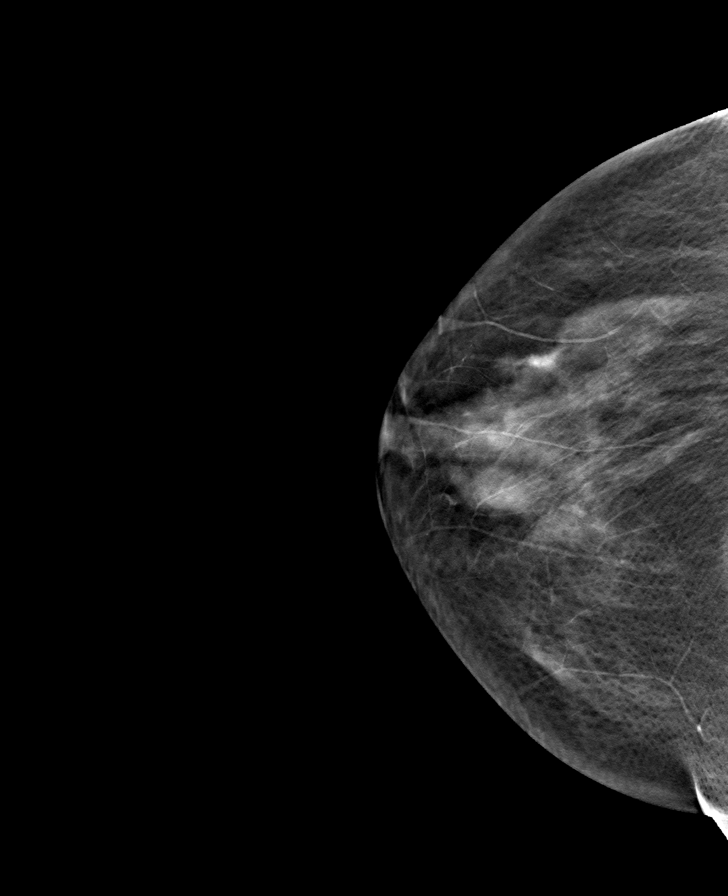

[L MLO tomo · tomo slice 43/85.0]
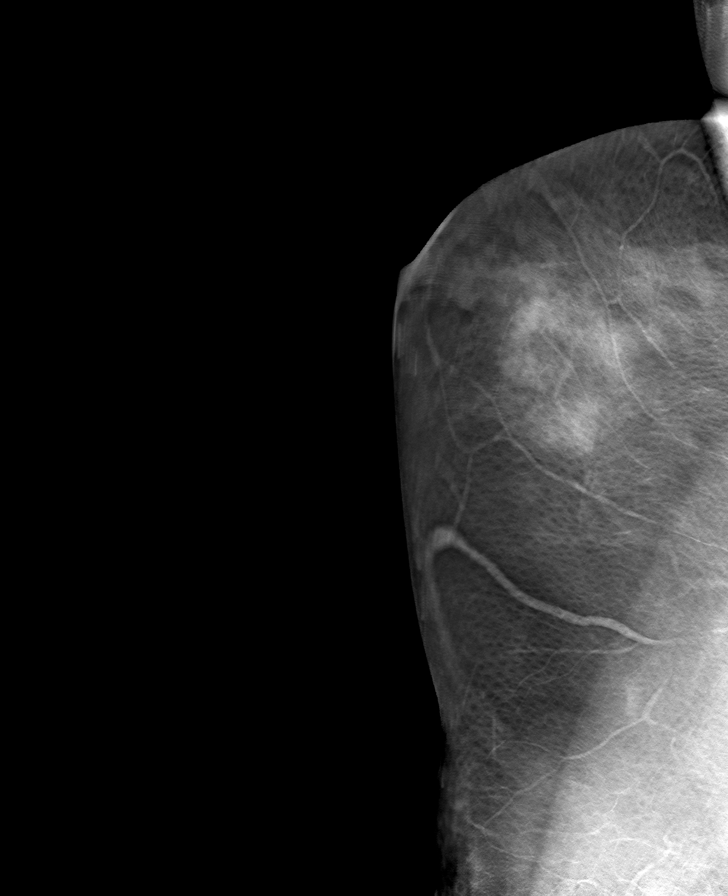

[8 of 24 positions shown; findings below may reference images not displayed]

ACR Breast Density Category b: There are scattered areas of
fibroglandular density.
FINDINGS: There are no findings suspicious for malignancy. The images were
evaluated with computer-aided detection.
IMPRESSION: No mammographic evidence of malignancy. A result letter of this
screening mammogram will be mailed directly to the patient.

RECOMMENDATION:
Screening mammogram in one year. (Code:WJ-I-BG6)

BI-RADS CATEGORY  1: Negative.

## 2021-11-02 ENCOUNTER — Ambulatory Visit: Payer: No Typology Code available for payment source

## 2021-11-02 ENCOUNTER — Ambulatory Visit (INDEPENDENT_AMBULATORY_CARE_PROVIDER_SITE_OTHER): Payer: No Typology Code available for payment source

## 2021-11-02 VITALS — Ht 71.0 in | Wt 239.0 lb

## 2021-11-02 DIAGNOSIS — Z0000 Encounter for general adult medical examination without abnormal findings: Secondary | ICD-10-CM

## 2021-11-02 DIAGNOSIS — Z780 Asymptomatic menopausal state: Secondary | ICD-10-CM | POA: Diagnosis not present

## 2021-11-02 NOTE — Patient Instructions (Addendum)
?  Brenda Carr , ?Thank you for taking time to come for your Medicare Wellness Visit. I appreciate your ongoing commitment to your health goals. Please review the following plan we discussed and let me know if I can assist you in the future.  ? ?These are the goals we discussed: ? Goals   ? ?  Maintain Healthy Lifestyle   ?  Stay active ?Healthy diet ?  ? ?  ?  ?This is a list of the screening recommended for you and due dates:  ?Health Maintenance  ?Topic Date Due  ? DEXA scan (bone density measurement)  Never done  ? Flu Shot  11/05/2021*  ? Zoster (Shingles) Vaccine (1 of 2) 02/02/2022*  ? Hepatitis C Screening: USPSTF Recommendation to screen - Ages 18-79 yo.  03/08/2022*  ? Pneumonia Vaccine (1 - PCV) 11/03/2022*  ? Tetanus Vaccine  11/03/2022*  ? Mammogram  10/02/2022  ? Colon Cancer Screening  06/12/2023  ? HPV Vaccine  Aged Out  ? COVID-19 Vaccine  Discontinued  ?*Topic was postponed. The date shown is not the original due date.  ?  ?

## 2021-11-02 NOTE — Progress Notes (Signed)
Subjective:   Brenda Carr is a 66 y.o. female who presents for an Initial Medicare Annual Wellness Visit.  Review of Systems    No ROS.  Medicare Wellness Virtual Visit.  Visual/audio telehealth visit, UTA vital signs.   See social history for additional risk factors.   Cardiac Risk Factors include: advanced age (>67men, >30 women)     Objective:    Today's Vitals   11/02/21 0834  Weight: 239 lb (108.4 kg)  Height: 5\' 11"  (1.803 m)   Body mass index is 33.33 kg/m.     11/02/2021    8:39 AM 09/24/2021    3:55 PM 06/11/2018    7:56 AM 12/24/2017   12:51 PM  Advanced Directives  Does Patient Have a Medical Advance Directive? Yes No No No  Type of Estate agent of [place];Living will     Does patient want to make changes to medical advance directive? No - Patient declined     Copy of Healthcare Power of Attorney in Chart? No - copy requested     Would patient like information on creating a medical advance directive?   No - Patient declined    Current Medications (verified) Outpatient Encounter Medications as of 11/02/2021  Medication Sig   amLODipine (NORVASC) 5 MG tablet Take 1 tablet (5 mg total) by mouth daily.   ARMOUR THYROID 90 MG tablet Take 180 mg on Sunday and Wednesday and 90 mg all the other days.   losartan (COZAAR) 100 MG tablet Take 1 tablet (100 mg total) by mouth at bedtime.   [DISCONTINUED] azithromycin (ZITHROMAX) 250 MG tablet Take 1 tablet (250 mg total) by mouth daily. Take first 2 tablets together, then 1 every day until finished.   [DISCONTINUED] predniSONE (DELTASONE) 20 MG tablet Take 2 tablets (40 mg total) by mouth daily.   No facility-administered encounter medications on file as of 11/02/2021.    Allergies (verified) Lamisil [terbinafine hcl]   History: Past Medical History:  Diagnosis Date   GERD (gastroesophageal reflux disease)    Hypertension    Thyroid disease    graves disease. Thyroid  ablated.   Past  Surgical History:  Procedure Laterality Date   CHOLECYSTECTOMY  2006   COLONOSCOPY WITH PROPOFOL N/A 06/11/2018   Procedure: COLONOSCOPY WITH BIOPSY;  Surgeon: Midge Minium, MD;  Location: [facility];  Service: Endoscopy;  Laterality: N/A;   POLYPECTOMY N/A 06/11/2018   Procedure: POLYPECTOMY;  Surgeon: Midge Minium, MD;  Location: [facility];  Service: Endoscopy;  Laterality: N/A;   Family History  Problem Relation Age of Onset   Anuerysm Mother 76       aortic    Early death Mother    Congestive Heart Failure Father 79   Hearing loss Father    Heart disease Father    Hearing loss Brother    Hypertension Brother    Cancer Maternal Grandmother 33       colon    Early death Maternal Grandmother    Heart disease Maternal Grandfather    Cancer Paternal Grandmother    Mental illness Paternal Grandfather    Hypertension Brother    Cancer - Colon Paternal Aunt 57       colon   Social History   Socioeconomic History   Marital status: Married    Spouse name: Not on file   Number of children: Not on file   Years of education: Not on file   Highest education level: Not on  file  Occupational History   Not on file  Tobacco Use   Smoking status: Never   Smokeless tobacco: Never  Vaping Use   Vaping Use: Never used  Substance and Sexual Activity   Alcohol use: Yes    Comment: 3-4x/yr   Drug use: Never   Sexual activity: Not Currently  Other Topics Concern   Not on file  Social History Narrative   Not on file   Social Determinants of Health   Financial Resource Strain: Low Risk    Difficulty of Paying Living Expenses: Not hard at all  Food Insecurity: No Food Insecurity   Worried About Programme researcher, broadcasting/film/video in the Last Year: Never true   Ran Out of Food in the Last Year: Never true  Transportation Needs: No Transportation Needs   Lack of Transportation (Medical): No   Lack of Transportation (Non-Medical): No  Physical Activity: Insufficiently Active    Days of Exercise per Week: 3 days   Minutes of Exercise per Session: 40 min  Stress: No Stress Concern Present   Feeling of Stress : Not at all  Social Connections: Unknown   Frequency of Communication with Friends and Family: More than three times a week   Frequency of Social Gatherings with Friends and Family: More than three times a week   Attends Religious Services: Not on Scientist, clinical (histocompatibility and immunogenetics) or Organizations: Not on file   Attends Banker Meetings: Not on file   Marital Status: Married    Tobacco Counseling Counseling given: Not Answered   Clinical Intake:  Pre-visit preparation completed: Yes        Diabetes: No  How often do you need to have someone help you when you read instructions, pamphlets, or other written materials from your doctor or pharmacy?: 1 - Never   Interpreter Needed?: No    Activities of Daily Living    11/02/2021    8:40 AM  In your present state of health, do you have any difficulty performing the following activities:  Hearing? 0  Vision? 0  Difficulty concentrating or making decisions? 0  Walking or climbing stairs? 0  Dressing or bathing? 0  Doing errands, shopping? 0  Preparing Food and eating ? N  Using the Toilet? N  In the past six months, have you accidently leaked urine? N  Do you have problems with loss of bowel control? N  Managing your Medications? N  Managing your Finances? N  Housekeeping or managing your Housekeeping? N    Patient Care Team: Sherlene Shams, MD as PCP - General (Internal Medicine) Jim Like, RN as Registered Nurse Scarlett Presto, RN as Registered Nurse  Indicate any recent Medical Services you may have received from other than [facility] providers in the past year (date may be approximate).     Assessment:   This is a routine wellness examination for Brenda Carr.  Virtual Visit via Telephone Note  I connected with  Brenda Carr on 11/02/21 at  8:15 AM EDT by telephone  and verified that I am speaking with the correct person using two identifiers.  Persons participating in the virtual visit: patient/Nurse Health Advisor   I discussed the limitations of performing an evaluation and management service by telehealth. The patient expressed understanding and agreed to proceed. We continued and completed visit with audio only. Some vital signs may be absent or patient reported.   Hearing/Vision screen Hearing Screening - Comments:: Patient is able to  hear conversational tones without difficulty.  No issues reported.  Vision Screening - Comments:: Wears corrective lenses They have seen their ophthalmologist in the last 12 months.   Dietary issues and exercise activities discussed: Current Exercise Habits: Home exercise routine, Type of exercise: walking, Time (Minutes): 40, Frequency (Times/Week): 3, Weekly Exercise (Minutes/Week): 120, Intensity: Mild Healthy diet Good water intake    Goals Addressed             This Visit's Progress    Maintain Healthy Lifestyle       Stay active Healthy diet       Depression Screen    11/02/2021    8:37 AM 03/23/2021    3:41 PM 04/01/2019    3:42 PM  PHQ 2/9 Scores  PHQ - 2 Score 0 0 0  PHQ- 9 Score   0    Fall Risk    11/02/2021    8:40 AM 03/23/2021    3:41 PM 04/01/2019    3:40 PM  Fall Risk   Falls in the past year? 0 0 0  Number falls in past yr: 0    Follow up Falls evaluation completed Falls evaluation completed Falls evaluation completed    FALL RISK PREVENTION PERTAINING TO THE HOME: Home free of loose throw rugs in walkways, pet beds, electrical cords, etc? Yes  Adequate lighting in your home to reduce risk of falls? Yes   ASSISTIVE DEVICES UTILIZED TO PREVENT FALLS: Life alert? No  Use of a cane, walker or w/c? No    TIMED UP AND GO: Was the test performed? No .   Cognitive Function:  Patient is alert and oriented x3.  Normal cognitive status assessed by direct  observation/communication. No abnormalities found.        Immunizations Immunization History  Administered Date(s) Administered   [employer] Vaccination 09/30/2019, 12/04/2019   TDAP status: Due, Education has been provided regarding the importance of this vaccine. Advised may receive this vaccine at local pharmacy or Health Dept. Aware to provide a copy of the vaccination record if obtained from local pharmacy or Health Dept. Verbalized acceptance and understanding. Deferred.   Pneumococcal vaccine status: Declined,  Education has been provided regarding the importance of this vaccine but patient still declined. Advised may receive this vaccine at local pharmacy or Health Dept. Aware to provide a copy of the vaccination record if obtained from local pharmacy or Health Dept. Verbalized acceptance and understanding.   Covid vaccine- 2 completed.   Shingrix Completed?: No.    Education has been provided regarding the importance of this vaccine. Patient has been advised to call insurance company to determine out of pocket expense if they have not yet received this vaccine. Advised may also receive vaccine at local pharmacy or Health Dept. Verbalized acceptance and understanding.  Screening Tests Health Maintenance  Topic Date Due   DEXA SCAN  Never done   INFLUENZA VACCINE  11/05/2021 (Originally 03/08/2021)   Zoster Vaccines- Shingrix (1 of 2) 02/02/2022 (Originally 02/14/2005)   Hepatitis C Screening  03/08/2022 (Originally 02/14/1973)   Pneumonia Vaccine 39+ Years old (1 - PCV) 11/03/2022 (Originally 02/15/2020)   TETANUS/TDAP  11/03/2022 (Originally 02/14/1974)   MAMMOGRAM  10/02/2022   COLONOSCOPY (Pts 45-94yrs Insurance coverage will need to be confirmed)  06/12/2023   HPV VACCINES  Aged Out   COVID-19 Vaccine  Discontinued   Health Maintenance Health Maintenance Due  Topic Date Due   DEXA SCAN  Never done   Mammogram- scheduled 11/26/21  Bone density- ordered per consent.    Lung Cancer Screening: (Low Dose CT Chest recommended if Age 65-80 years, 30 pack-year currently smoking OR have quit w/in 15years.) does not qualify.   Hepatitis C Screening: discussed, deferred.   Vision Screening: Recommended annual ophthalmology exams for early detection of glaucoma and other disorders of the eye.  Dental Screening: Recommended annual dental exams for proper oral hygiene  Community Resource Referral / Chronic Care Management: CRR required this visit?  No   CCM required this visit?  No      Plan:   Keep all routine maintenance appointments.   I have personally reviewed and noted the following in the patient's chart:   Medical and social history Use of alcohol, tobacco or illicit drugs  Current medications and supplements including opioid prescriptions. Patient is not currently taking opioid prescriptions. Functional ability and status Nutritional status Physical activity Advanced directives List of other physicians Hospitalizations, surgeries, and ER visits in previous 12 months Vitals Screenings to include cognitive, depression, and falls Referrals and appointments  In addition, I have reviewed and discussed with patient certain preventive protocols, quality metrics, and best practice recommendations. A written personalized care plan for preventive services as well as general preventive health recommendations were provided to patient.     Ashok Pall, LPN   1/61/0960

## 2021-11-11 ENCOUNTER — Telehealth: Payer: Self-pay | Admitting: Internal Medicine

## 2021-11-11 DIAGNOSIS — E890 Postprocedural hypothyroidism: Secondary | ICD-10-CM

## 2021-11-11 DIAGNOSIS — E782 Mixed hyperlipidemia: Secondary | ICD-10-CM

## 2021-11-11 DIAGNOSIS — R5383 Other fatigue: Secondary | ICD-10-CM

## 2021-11-11 DIAGNOSIS — I10 Essential (primary) hypertension: Secondary | ICD-10-CM

## 2021-11-11 DIAGNOSIS — R7301 Impaired fasting glucose: Secondary | ICD-10-CM

## 2021-11-11 NOTE — Telephone Encounter (Signed)
Pt requesting labs... No lab orders in system... Pt would like to do labs before her physical on 05/02/2022 at 11:30am... Pt requesting callback...  ?

## 2021-11-11 NOTE — Telephone Encounter (Signed)
Pt would like to have labs done prior to her physical in September. I have ordered TSH, CBC, A1c, CMP, Lipid panel, microalbumin. Is there anything else that needs to be seen.  ?

## 2022-01-21 ENCOUNTER — Ambulatory Visit: Admit: 2022-01-21 | Payer: No Typology Code available for payment source | Admitting: Ophthalmology

## 2022-01-21 SURGERY — BLEPHAROPLASTY
Anesthesia: Monitor Anesthesia Care | Laterality: Bilateral

## 2022-01-26 ENCOUNTER — Ambulatory Visit
Admission: RE | Admit: 2022-01-26 | Discharge: 2022-01-26 | Disposition: A | Discharge status: Home / Self Care | Payer: No Typology Code available for payment source | Source: Ambulatory Visit | Attending: Internal Medicine | Admitting: Internal Medicine

## 2022-01-26 DIAGNOSIS — M8589 Other specified disorders of bone density and structure, multiple sites: Secondary | ICD-10-CM | POA: Diagnosis not present

## 2022-01-26 DIAGNOSIS — Z780 Asymptomatic menopausal state: Secondary | ICD-10-CM | POA: Insufficient documentation

## 2022-02-09 ENCOUNTER — Ambulatory Visit
Admission: RE | Admit: 2022-02-09 | Discharge: 2022-02-09 | Disposition: A | Discharge status: Home / Self Care | Payer: No Typology Code available for payment source | Source: Ambulatory Visit | Attending: Internal Medicine | Admitting: Internal Medicine

## 2022-02-09 DIAGNOSIS — Z1231 Encounter for screening mammogram for malignant neoplasm of breast: Secondary | ICD-10-CM | POA: Diagnosis not present

## 2022-02-10 ENCOUNTER — Other Ambulatory Visit: Payer: Self-pay | Admitting: Internal Medicine

## 2022-02-10 DIAGNOSIS — R921 Mammographic calcification found on diagnostic imaging of breast: Secondary | ICD-10-CM

## 2022-02-10 DIAGNOSIS — R928 Other abnormal and inconclusive findings on diagnostic imaging of breast: Secondary | ICD-10-CM

## 2022-02-12 ENCOUNTER — Other Ambulatory Visit: Payer: Self-pay | Admitting: Internal Medicine

## 2022-02-25 ENCOUNTER — Ambulatory Visit: Admit: 2022-02-25 | Payer: No Typology Code available for payment source | Admitting: Ophthalmology

## 2022-02-25 SURGERY — BLEPHAROPLASTY
Anesthesia: Monitor Anesthesia Care | Laterality: Bilateral

## 2022-03-11 ENCOUNTER — Other Ambulatory Visit: Payer: Self-pay | Admitting: Internal Medicine

## 2022-03-11 ENCOUNTER — Ambulatory Visit
Admission: RE | Admit: 2022-03-11 | Discharge: 2022-03-11 | Disposition: A | Discharge status: Home / Self Care | Payer: No Typology Code available for payment source | Source: Ambulatory Visit | Attending: Internal Medicine | Admitting: Internal Medicine

## 2022-03-11 DIAGNOSIS — R928 Other abnormal and inconclusive findings on diagnostic imaging of breast: Secondary | ICD-10-CM | POA: Diagnosis not present

## 2022-03-11 DIAGNOSIS — R921 Mammographic calcification found on diagnostic imaging of breast: Secondary | ICD-10-CM

## 2022-03-11 NOTE — Assessment & Plan Note (Addendum)
6 month follow up advised per August 2023 diagnostic mammogram

## 2022-03-14 ENCOUNTER — Other Ambulatory Visit: Payer: Self-pay | Admitting: Internal Medicine

## 2022-04-13 ENCOUNTER — Other Ambulatory Visit: Payer: Self-pay

## 2022-04-13 ENCOUNTER — Telehealth: Payer: Self-pay | Admitting: Internal Medicine

## 2022-04-13 NOTE — Telephone Encounter (Signed)
Brenda Carr from [facility] is requesting a refill for losartan (COZAAR) 100 MG tablet.

## 2022-04-13 NOTE — Telephone Encounter (Signed)
I called patient to confirm pharmacy & she actually is not in need of this script as of yet. She stated that when she does that she will submit to [facility] for refill.

## 2022-04-22 ENCOUNTER — Ambulatory Visit: Admit: 2022-04-22 | Payer: No Typology Code available for payment source | Admitting: Ophthalmology

## 2022-04-22 SURGERY — BLEPHAROPLASTY
Anesthesia: Topical | Laterality: Bilateral

## 2022-05-02 ENCOUNTER — Encounter: Payer: No Typology Code available for payment source | Admitting: Internal Medicine

## 2022-05-04 DIAGNOSIS — Z008 Encounter for other general examination: Secondary | ICD-10-CM | POA: Diagnosis not present

## 2022-05-04 DIAGNOSIS — Z6830 Body mass index (BMI) 30.0-30.9, adult: Secondary | ICD-10-CM | POA: Diagnosis not present

## 2022-05-04 DIAGNOSIS — I10 Essential (primary) hypertension: Secondary | ICD-10-CM | POA: Diagnosis not present

## 2022-05-04 DIAGNOSIS — E039 Hypothyroidism, unspecified: Secondary | ICD-10-CM | POA: Diagnosis not present

## 2022-05-04 DIAGNOSIS — E669 Obesity, unspecified: Secondary | ICD-10-CM | POA: Diagnosis not present

## 2022-05-06 ENCOUNTER — Other Ambulatory Visit: Payer: Self-pay

## 2022-05-06 ENCOUNTER — Telehealth: Payer: Self-pay | Admitting: Internal Medicine

## 2022-05-06 NOTE — Telephone Encounter (Signed)
Mira from [facility] request refill for pt-losartan sent to mail order

## 2022-05-06 NOTE — Telephone Encounter (Signed)
Pt fills through [facility] will submit when needed.

## 2022-05-09 ENCOUNTER — Other Ambulatory Visit: Payer: No Typology Code available for payment source

## 2022-05-11 ENCOUNTER — Other Ambulatory Visit: Payer: No Typology Code available for payment source

## 2022-05-16 ENCOUNTER — Encounter: Payer: No Typology Code available for payment source | Admitting: Internal Medicine

## 2022-05-18 ENCOUNTER — Encounter: Payer: No Typology Code available for payment source | Admitting: Internal Medicine

## 2022-05-25 ENCOUNTER — Other Ambulatory Visit: Payer: Self-pay | Admitting: Internal Medicine

## 2022-06-08 ENCOUNTER — Other Ambulatory Visit: Payer: No Typology Code available for payment source

## 2022-06-15 ENCOUNTER — Encounter: Payer: No Typology Code available for payment source | Admitting: Internal Medicine

## 2022-06-17 DIAGNOSIS — H35373 Puckering of macula, bilateral: Secondary | ICD-10-CM | POA: Diagnosis not present

## 2022-07-06 ENCOUNTER — Other Ambulatory Visit: Payer: Self-pay | Admitting: Internal Medicine

## 2022-07-08 ENCOUNTER — Other Ambulatory Visit: Payer: No Typology Code available for payment source

## 2022-07-08 DIAGNOSIS — Z0100 Encounter for examination of eyes and vision without abnormal findings: Secondary | ICD-10-CM | POA: Diagnosis not present

## 2022-07-11 ENCOUNTER — Other Ambulatory Visit: Payer: Self-pay

## 2022-07-11 MED ORDER — LOSARTAN POTASSIUM 100 MG PO TABS: 100.0000 mg | ORAL_TABLET | Freq: Every day | ORAL | 0 refills | Status: DC

## 2022-07-12 ENCOUNTER — Other Ambulatory Visit (INDEPENDENT_AMBULATORY_CARE_PROVIDER_SITE_OTHER): Payer: No Typology Code available for payment source

## 2022-07-12 ENCOUNTER — Telehealth: Payer: Self-pay

## 2022-07-12 DIAGNOSIS — I10 Essential (primary) hypertension: Secondary | ICD-10-CM | POA: Diagnosis not present

## 2022-07-12 DIAGNOSIS — R7301 Impaired fasting glucose: Secondary | ICD-10-CM

## 2022-07-12 DIAGNOSIS — E782 Mixed hyperlipidemia: Secondary | ICD-10-CM | POA: Diagnosis not present

## 2022-07-12 DIAGNOSIS — R5383 Other fatigue: Secondary | ICD-10-CM

## 2022-07-12 DIAGNOSIS — E890 Postprocedural hypothyroidism: Secondary | ICD-10-CM | POA: Diagnosis not present

## 2022-07-12 LAB — COMPREHENSIVE METABOLIC PANEL
ALT: 15 U/L (ref 0–35)
AST: 16 U/L (ref 0–37)
Albumin: 4.3 g/dL (ref 3.5–5.2)
Alkaline Phosphatase: 81 U/L (ref 39–117)
BUN: 13 mg/dL (ref 6–23)
CO2: 31 mEq/L (ref 19–32)
Calcium: 9.5 mg/dL (ref 8.4–10.5)
Chloride: 103 mEq/L (ref 96–112)
Creatinine, Ser: 0.69 mg/dL (ref 0.40–1.20)
GFR: 89.81 mL/min (ref >60.00)
Glucose, Bld: 89 mg/dL (ref 70–99)
Potassium: 4.1 mEq/L (ref 3.5–5.1)
Sodium: 140 mEq/L (ref 135–145)
Total Bilirubin: 1.3 mg/dL — ABNORMAL HIGH (ref 0.2–1.2)
Total Protein: 7 g/dL (ref 6.0–8.3)

## 2022-07-12 LAB — CBC WITH DIFFERENTIAL/PLATELET
Basophils Absolute: 0 10*3/uL (ref 0.0–0.1)
Basophils Relative: 0.7 % (ref 0.0–3.0)
Eosinophils Absolute: 0.3 10*3/uL (ref 0.0–0.7)
Eosinophils Relative: 4.5 % (ref 0.0–5.0)
HCT: 39.9 % (ref 36.0–46.0)
Hemoglobin: 13.5 g/dL (ref 12.0–15.0)
Lymphocytes Relative: 34.6 % (ref 12.0–46.0)
Lymphs Abs: 2.2 10*3/uL (ref 0.7–4.0)
MCHC: 34 g/dL (ref 30.0–36.0)
MCV: 86.9 fl (ref 78.0–100.0)
Monocytes Absolute: 0.5 10*3/uL (ref 0.1–1.0)
Monocytes Relative: 7.6 % (ref 3.0–12.0)
Neutro Abs: 3.4 10*3/uL (ref 1.4–7.7)
Neutrophils Relative %: 52.6 % (ref 43.0–77.0)
Platelets: 217 10*3/uL (ref 150.0–400.0)
RBC: 4.59 Mil/uL (ref 3.87–5.11)
RDW: 14 % (ref 11.5–15.5)
WBC: 6.4 10*3/uL (ref 4.0–10.5)

## 2022-07-12 LAB — LIPID PANEL
Cholesterol: 201 mg/dL — ABNORMAL HIGH (ref 0–200)
HDL: 42 mg/dL (ref >39.00)
LDL Cholesterol: 126 mg/dL — ABNORMAL HIGH (ref 0–99)
NonHDL: 159.28
Total CHOL/HDL Ratio: 5
Triglycerides: 166 mg/dL — ABNORMAL HIGH (ref 0.0–149.0)
VLDL: 33.2 mg/dL (ref 0.0–40.0)

## 2022-07-12 LAB — HEMOGLOBIN A1C: Hgb A1c MFr Bld: 5.4 % (ref 4.6–6.5)

## 2022-07-12 LAB — MICROALBUMIN / CREATININE URINE RATIO
Creatinine,U: 104.2 mg/dL
Microalb Creat Ratio: 0.7 mg/g (ref 0.0–30.0)
Microalb, Ur: <0.7 mg/dL (ref 0.0–1.9)

## 2022-07-12 LAB — TSH: TSH: 2.86 u[IU]/mL (ref 0.35–5.50)

## 2022-07-12 NOTE — Telephone Encounter (Signed)
Pt is aware that medication was sent yesterday at 11:38 and it was confirmed that it was received.

## 2022-07-12 NOTE — Telephone Encounter (Signed)
Patient states losartan (COZAAR) 100 MG tablet has to be approved by [place] for [employer], [place].  [facility] has reached out to [place] but have not heard back.  Patient states their call number is [phone].

## 2022-07-15 ENCOUNTER — Encounter: Payer: Self-pay | Admitting: Internal Medicine

## 2022-07-15 ENCOUNTER — Ambulatory Visit (INDEPENDENT_AMBULATORY_CARE_PROVIDER_SITE_OTHER): Payer: No Typology Code available for payment source | Admitting: Internal Medicine

## 2022-07-15 VITALS — BP 136/80 | HR 76 | Temp 97.7°F | Ht 71.0 in | Wt 229.8 lb

## 2022-07-15 DIAGNOSIS — R921 Mammographic calcification found on diagnostic imaging of breast: Secondary | ICD-10-CM

## 2022-07-15 DIAGNOSIS — Z0000 Encounter for general adult medical examination without abnormal findings: Secondary | ICD-10-CM

## 2022-07-15 DIAGNOSIS — I10 Essential (primary) hypertension: Secondary | ICD-10-CM | POA: Diagnosis not present

## 2022-07-15 DIAGNOSIS — E890 Postprocedural hypothyroidism: Secondary | ICD-10-CM | POA: Diagnosis not present

## 2022-07-15 NOTE — Progress Notes (Unsigned)
Patient ID: Brenda Carr, female    DOB: 1955/09/18  Age: 66 y.o. MRN: [id number]  The patient is here for annual preventive  examination and management of other chronic and acute problems.   The risk factors are reflected in the social history.  The roster of all physicians providing medical care to patient - is listed in the Snapshot section of the chart.  Activities of daily living:  The patient is 100% independent in all ADLs: dressing, toileting, feeding as well as independent mobility  Home safety : The patient has smoke detectors in the home. They wear seatbelts.  There are no firearms at home. There is no violence in the home.   There is no risks for hepatitis, STDs or HIV. There is no   history of blood transfusion. They have no travel history to infectious disease endemic areas of the world.  The patient has seen their dentist in the last six month. They have seen their eye doctor in the last year. They admit to slight hearing difficulty with regard to whispered voices and some television programs.  They have deferred audiologic testing in the last year.  They do not  have excessive sun exposure. Discussed the need for sun protection: hats, long sleeves and use of sunscreen if there is significant sun exposure.   Diet: the importance of a healthy diet is discussed. They do have a healthy diet.  The benefits of regular aerobic exercise were discussed. She walks 4 times per week ,  20 minutes.   Depression screen: there are no signs or vegative symptoms of depression- irritability, change in appetite, anhedonia, sadness/tearfullness.  Cognitive assessment: the patient manages all their financial and personal affairs and is actively engaged. They could relate day,date,year and events; recalled 2/3 objects at 3 minutes; performed clock-face test normally.  The following portions of the patient's history were reviewed and updated as appropriate: allergies, current medications, past  family history, past medical history,  past surgical history, past social history  and problem list.  Visual acuity was not assessed per patient preference since she has regular follow up with her ophthalmologist. Hearing and body mass index were assessed and reviewed.   During the course of the visit the patient was educated and counseled about appropriate screening and preventive services including : fall prevention , diabetes screening, nutrition counseling, colorectal cancer screening, and recommended immunizations.    CC: There were no encounter diagnoses.   1) HTN :  HOME readings 128/80  History Imunique has a past medical history of GERD (gastroesophageal reflux disease), Hypertension, and Thyroid disease.   She has a past surgical history that includes Cholecystectomy (2006); Colonoscopy with propofol (N/A, 06/11/2018); and polypectomy (N/A, 06/11/2018).   Her family history includes Anuerysm (age of onset: 1) in her mother; Cancer in her paternal grandmother; Cancer (age of onset: 49) in her maternal grandmother; Cancer - Colon (age of onset: 75) in her paternal aunt; Congestive Heart Failure (age of onset: 54) in her father; Early death in her maternal grandmother and mother; Hearing loss in her brother and father; Heart disease in her father and maternal grandfather; Hypertension in her brother and brother; Mental illness in her paternal grandfather.She reports that she has never smoked. She has never used smokeless tobacco. She reports current alcohol use. She reports that she does not use drugs.  Outpatient Medications Prior to Visit  Medication Sig Dispense Refill   amLODipine (NORVASC) 5 MG tablet TAKE 1 TABLET DAILY 90 tablet 0  ARMOUR THYROID 90 MG tablet TAKE 1 TABLET DAILY 90 tablet 1   losartan (COZAAR) 100 MG tablet Take 1 tablet (100 mg total) by mouth at bedtime. 90 tablet 0   No facility-administered medications prior to visit.    Review of Systems  Objective:  BP  136/80   Pulse 76   Temp 97.7 F (36.5 C) (Oral)   Ht '5\' 11"'$  (1.803 m)   Wt 229 lb 12.8 oz (104.2 kg)   SpO2 98%   BMI 32.05 kg/m   Physical Exam  Physical Exam   Assessment & Plan:   Problem List Items Addressed This Visit   None   I am having Lucky Cowboy. Myrtie Hawk "[place]" maintain her Armour Thyroid, amLODipine, and losartan.    I provided 40 minutes of  face-to-face time during this encounter reviewing patient's current problems and past surgeries,  recent labs and imaging studies, providing counseling on the above mentioned problems , and coordination  of care .   Follow-up: No follow-ups on file.   Crecencio Mc, MD

## 2022-07-15 NOTE — Assessment & Plan Note (Signed)
6 month follow up diagnostic mammogram will be due in Feb 2024

## 2022-07-15 NOTE — Patient Instructions (Addendum)
Your screening test  for  nephropathy with a Urinary microalbumin /creatinine ratio was normal and your  urine was negative for protein .  This  Is a good and normal result and means that your elevated blood pressure that was noted recently  has NOT  affected your kidney's ability to filter. HOWEVER, you do need to continue checking your blood pressure  so additional  medication can be prescribed if your reading is still > 130/80    The Tdap (tetanus-diphtheria-whooping cough vaccine ) and Shingrix vaccines are now  COVERED BY MEDICARE if you get them at your pharmacy as of  January 1.   You can expect 24 hours of flu like symptoms after receiving the shingles vaccine, so plan accordingly.     Colonoscopy is next due Nov 2024  I have ordered the diagnostic 6 month follow up Mammogram iI which s due in Feb for the left breast . . The right breast cannot be screened until July

## 2022-07-17 NOTE — Assessment & Plan Note (Signed)
Managed with Armour thyroid due to history of intolerance to Synthroid.  Thyroid function is underactive on current dose.  No current changes needed. Will increase dose to 180 two times weekly,  90 mg all other days   Lab Results  Component Value Date   TSH 2.86 07/12/2022

## 2022-07-17 NOTE — Assessment & Plan Note (Signed)
Home readings have been consistently < 130/80.   Continue current doses of amlodipine 5 mg daily and 100 mg losartan

## 2022-07-17 NOTE — Assessment & Plan Note (Signed)

## 2022-07-19 ENCOUNTER — Telehealth: Payer: Self-pay

## 2022-07-19 DIAGNOSIS — R921 Mammographic calcification found on diagnostic imaging of breast: Secondary | ICD-10-CM

## 2022-07-19 NOTE — Telephone Encounter (Signed)
I believe I have corrected the order. Let me know if still not correct.

## 2022-07-20 ENCOUNTER — Other Ambulatory Visit: Payer: Self-pay | Admitting: Internal Medicine

## 2022-08-10 ENCOUNTER — Encounter: Payer: Self-pay | Admitting: Internal Medicine

## 2022-08-31 ENCOUNTER — Encounter: Payer: Self-pay | Admitting: Internal Medicine

## 2022-09-04 MED ORDER — POLIOVIRUS VACCINE INACTIVATED IJ INJ: 0.5000 mL | INJECTION | Freq: Once | INTRAMUSCULAR | 0 refills | Status: AC

## 2022-09-04 MED ORDER — TYPHIM VI 25 MCG/0.5ML IM SOLN: 0.5000 mL | Freq: Once | INTRAMUSCULAR | 0 refills | Status: AC

## 2022-09-15 ENCOUNTER — Other Ambulatory Visit: Payer: Self-pay | Admitting: Internal Medicine

## 2022-09-21 ENCOUNTER — Ambulatory Visit
Admission: RE | Admit: 2022-09-21 | Discharge: 2022-09-21 | Disposition: A | Discharge status: Home / Self Care | Payer: No Typology Code available for payment source | Source: Ambulatory Visit | Attending: Internal Medicine | Admitting: Internal Medicine

## 2022-09-21 DIAGNOSIS — R921 Mammographic calcification found on diagnostic imaging of breast: Secondary | ICD-10-CM | POA: Insufficient documentation

## 2022-10-13 ENCOUNTER — Other Ambulatory Visit: Payer: Self-pay | Admitting: Internal Medicine

## 2022-11-07 ENCOUNTER — Telehealth: Payer: Self-pay | Admitting: Internal Medicine

## 2022-11-07 NOTE — Telephone Encounter (Signed)
Copied from [place] [id number]. Topic: Medicare AWV >> Nov 07, 2022 12:11 PM Lollie Marrow wrote: Reason for CRM: Called patient to schedule Medicare Annual Wellness Visit (AWV). Left message for patient to call back and schedule Medicare Annual Wellness Visit (AWV).  Last date of AWV: 11/02/2021  Please schedule an AWVS appointment at any time with [facility].  If any questions, please contact me at [phone].    Thank you,  [facility] Direct dial  [phone]

## 2022-11-11 ENCOUNTER — Telehealth: Payer: Self-pay | Admitting: Internal Medicine

## 2022-11-11 NOTE — Telephone Encounter (Signed)
Contacted Delbert Phenix to schedule their annual wellness visit. Appointment made for 11/17/2022.  Thank you,  [facility] Support [facility] Direct dial  [phone]

## 2022-11-17 ENCOUNTER — Telehealth: Payer: Self-pay

## 2022-11-17 NOTE — Telephone Encounter (Signed)
Called patient at appointed time for scheduled AWV. No answer. Left vm to call the office back. Okay to reschedule.

## 2022-11-21 ENCOUNTER — Ambulatory Visit: Payer: No Typology Code available for payment source

## 2022-11-21 VITALS — BP 124/81 | HR 86 | Ht 71.0 in | Wt 229.0 lb

## 2022-11-21 DIAGNOSIS — Z0000 Encounter for general adult medical examination without abnormal findings: Secondary | ICD-10-CM | POA: Diagnosis not present

## 2022-11-21 NOTE — Progress Notes (Addendum)
Subjective:   Brenda Carr is a 67 y.o. female who presents for Medicare Annual (Subsequent) preventive examination.  Review of Systems    No ROS.  Medicare Wellness Virtual Visit.  Visual/audio telehealth visit, UTA vital signs.   See social history for additional risk factors.   Cardiac Risk Factors include: advanced age (>7men, >67 women)     Objective:    Today's Vitals   11/21/22 1538  BP: 124/81  Pulse: 86  Weight: 229 lb (103.9 kg)  Height:  (1.803 m)   Body mass index is 31.94 kg/m.  Blood pressure readings for some am/pm on dates below: 4/11 is 122/88, p87 4/12 is 130/44, P79 119/78 P69 4/13 is 114/84 P82,  125/79  4/14 is 124/80, P77 4/15 is 124/81 P86, 109/71 P86      11/21/2022    3:50 PM 11/02/2021    8:39 AM 09/24/2021    3:55 PM 06/11/2018    7:56 AM 12/24/2017   12:51 PM  Advanced Directives  Does Patient Have a Medical Advance Directive? Yes Yes No No No  Type of Estate agent of [place];Living will Healthcare Power of [place];Living will     Does patient want to make changes to medical advance directive? No - Patient declined No - Patient declined     Copy of Healthcare Power of Attorney in Chart? No - copy requested No - copy requested     Would patient like information on creating a medical advance directive?    No - Patient declined     Current Medications (verified) Outpatient Encounter Medications as of 11/21/2022  Medication Sig   amLODipine (NORVASC) 5 MG tablet TAKE 1 TABLET DAILY   ARMOUR THYROID 90 MG tablet TAKE 1 TABLET DAILY   losartan (COZAAR) 100 MG tablet TAKE 1 TABLET AT BEDTIME   No facility-administered encounter medications on file as of 11/21/2022.    Allergies (verified) Lamisil [terbinafine hcl]   History: Past Medical History:  Diagnosis Date   GERD (gastroesophageal reflux disease)    Hypertension    Thyroid disease    graves disease. Thyroid  ablated.   Past Surgical History:   Procedure Laterality Date   CHOLECYSTECTOMY  2006   COLONOSCOPY WITH PROPOFOL N/A 06/11/2018   Procedure: COLONOSCOPY WITH BIOPSY;  Surgeon: Midge Minium, MD;  Location: [facility];  Service: Endoscopy;  Laterality: N/A;   POLYPECTOMY N/A 06/11/2018   Procedure: POLYPECTOMY;  Surgeon: Midge Minium, MD;  Location: [facility];  Service: Endoscopy;  Laterality: N/A;   Family History  Problem Relation Age of Onset   Anuerysm Mother 43       aortic    Early death Mother    Congestive Heart Failure Father 57   Hearing loss Father    Heart disease Father    Cancer - Colon Paternal Aunt 3       colon   Cancer Maternal Grandmother 66       colon    Early death Maternal Grandmother    Heart disease Maternal Grandfather    Cancer Paternal Grandmother    Mental illness Paternal Grandfather    Hearing loss Brother    Hypertension Brother    Hypertension Brother    Breast cancer Neg Hx    Social History   Socioeconomic History   Marital status: Married    Spouse name: Not on file   Number of children: Not on file   Years of education: Not on file  Highest education level: Not on file  Occupational History   Not on file  Tobacco Use   Smoking status: Never   Smokeless tobacco: Never  Vaping Use   Vaping Use: Never used  Substance and Sexual Activity   Alcohol use: Yes    Comment: 3-4x/yr   Drug use: Never   Sexual activity: Not Currently  Other Topics Concern   Not on file  Social History Narrative   Not on file   Social Determinants of Health   Financial Resource Strain: Low Risk  (11/21/2022)   Overall Financial Resource Strain (CARDIA)    Difficulty of Paying Living Expenses: Not hard at all  Food Insecurity: No Food Insecurity (11/21/2022)   Hunger Vital Sign    Worried About Running Out of Food in the Last Year: Never true    Ran Out of Food in the Last Year: Never true  Transportation Needs: No Transportation Needs (11/21/2022)   PRAPARE -  Administrator, Civil Service (Medical): No    Lack of Transportation (Non-Medical): No  Physical Activity: Insufficiently Active (11/21/2022)   Exercise Vital Sign    Days of Exercise per Week: 3 days    Minutes of Exercise per Session: 40 min  Stress: No Stress Concern Present (11/21/2022)   [employer] of Occupational Health - Occupational Stress Questionnaire    Feeling of Stress : Not at all  Social Connections: Unknown (11/21/2022)   Social Connection and Isolation Panel [NHANES]    Frequency of Communication with Friends and Family: More than three times a week    Frequency of Social Gatherings with Friends and Family: More than three times a week    Attends Religious Services: Not on Marketing executive or Organizations: Not on file    Attends Banker Meetings: Not on file    Marital Status: Married    Tobacco Counseling Counseling given: Not Answered   Clinical Intake:  Pre-visit preparation completed: Yes        Diabetes: No  How often do you need to have someone help you when you read instructions, pamphlets, or other written materials from your doctor or pharmacy?: 1 - Never    Interpreter Needed?: No      Activities of Daily Living    11/21/2022    3:51 PM  In your present state of health, do you have any difficulty performing the following activities:  Hearing? 0  Vision? 0  Difficulty concentrating or making decisions? 0  Walking or climbing stairs? 0  Dressing or bathing? 0  Doing errands, shopping? 0  Preparing Food and eating ? N  Using the Toilet? N  In the past six months, have you accidently leaked urine? N  Do you have problems with loss of bowel control? N  Managing your Medications? N  Managing your Finances? N  Housekeeping or managing your Housekeeping? N    Patient Care Team: Sherlene Shams, MD as PCP - General (Internal Medicine) Jim Like, RN as Registered Nurse Scarlett Presto,  RN (Inactive) as Registered Nurse  Indicate any recent Medical Services you may have received from other than [facility] providers in the past year (date may be approximate).     Assessment:   This is a routine wellness examination for Brenda Carr.  I connected with  Delbert Phenix on 11/21/22 by a audio enabled telemedicine application and verified that I am speaking with the correct person using two  identifiers.  Patient Location: Home  Provider Location: Office/Clinic  I discussed the limitations of evaluation and management by telemedicine. The patient expressed understanding and agreed to proceed.   Hearing/Vision screen Hearing Screening - Comments:: Patient is able to hear conversational tones without difficulty.  No issues reported.   Vision Screening - Comments:: Followed by [facility] Wears corrective lenses. They have seen their ophthalmologist in the last 12 months.  Dietary issues and exercise activities discussed: Current Exercise Habits: Home exercise routine, Type of exercise: walking, Time (Minutes): 40, Frequency (Times/Week): 3, Weekly Exercise (Minutes/Week): 120, Intensity: Mild Regular diet Good water intake   Goals Addressed             This Visit's Progress    Increase physical activity       Add resistance training        Depression Screen    11/21/2022    3:51 PM 07/15/2022    1:38 PM 11/02/2021    8:37 AM 03/23/2021    3:41 PM 04/01/2019    3:42 PM  PHQ 2/9 Scores  PHQ - 2 Score 0 0 0 0 0  PHQ- 9 Score     0    Fall Risk    11/21/2022    3:54 PM 07/15/2022    1:37 PM 11/02/2021    8:40 AM 03/23/2021    3:41 PM 04/01/2019    3:40 PM  Fall Risk   Falls in the past year? 0 0 0 0 0  Number falls in past yr: 0 0 0    Injury with Fall? 0 0     Risk for fall due to :  No Fall Risks     Follow up Falls evaluation completed;Falls prevention discussed Falls evaluation completed Falls evaluation completed Falls evaluation completed Falls  evaluation completed    FALL RISK PREVENTION PERTAINING TO THE HOME: Home free of loose throw rugs in walkways, pet beds, electrical cords, etc? Yes  Adequate lighting in your home to reduce risk of falls? Yes   ASSISTIVE DEVICES UTILIZED TO PREVENT FALLS: Life alert? No  Use of a cane, walker or w/c? No  Grab bars in the bathroom? Yes Shower chair or bench in shower? Yes Elevated toilet seat or a handicapped toilet? Yes  TIMED UP AND GO: Was the test performed? No .   Cognitive Function:        11/21/2022    4:03 PM  6CIT Screen  What Year? 0 points  What month? 0 points  What time? 0 points  Months in reverse 0 points    Immunizations Immunization History  Administered Date(s) Administered   Hep A / Hep B 08/23/2022, 10/03/2022   IPV 09/06/2022   Moderna Sars-Covid-2 Vaccination 09/30/2019, 12/04/2019   Tdap 08/10/2022   Typhoid Inactivated 09/06/2022   Pneumococcal vaccine status: Due, Education has been provided regarding the importance of this vaccine. Advised may receive this vaccine at local pharmacy or Health Dept. Aware to provide a copy of the vaccination record if obtained from local pharmacy or Health Dept. Verbalized acceptance and understanding.  Shingrix Completed?: No.    Education has been provided regarding the importance of this vaccine. Patient has been advised to call insurance company to determine out of pocket expense if they have not yet received this vaccine. Advised may also receive vaccine at local pharmacy or Health Dept. Verbalized acceptance and understanding.  Screening Tests Health Maintenance  Topic Date Due   Hepatitis C Screening  Never done   Pneumonia Vaccine 69+ Years old (1 of 1 - PCV) 01/07/2023 (Originally 02/15/2020)   Zoster Vaccines- Shingrix (1 of 2) 02/20/2023 (Originally 02/14/2005)   INFLUENZA VACCINE  03/09/2023   COLONOSCOPY (Pts 45-65yrs Insurance coverage will need to be confirmed)  06/12/2023   Medicare Annual  Wellness (AWV)  11/21/2023   MAMMOGRAM  02/10/2024   DTaP/Tdap/Td (2 - Td or Tdap) 08/10/2032   DEXA SCAN  Completed   HPV VACCINES  Aged Out   COVID-19 Vaccine  Discontinued    Health Maintenance Health Maintenance Due  Topic Date Due   Hepatitis C Screening  Never done   Mammogram- deferred ordering this visit.   Lung Cancer Screening: (Low Dose CT Chest recommended if Age 59-80 years, 30 pack-year currently smoking OR have quit w/in 15years.) does not qualify.   Hepatitis C Screening: deferred per patient.   Vision Screening: Recommended annual ophthalmology exams for early detection of glaucoma and other disorders of the eye.  Dental Screening: Recommended annual dental exams for proper oral hygiene  Community Resource Referral / Chronic Care Management: CRR required this visit?  No   CCM required this visit?  No      Plan:     I have personally reviewed and noted the following in the patient's chart:   Medical and social history Use of alcohol, tobacco or illicit drugs  Current medications and supplements including opioid prescriptions. Patient is not currently taking opioid prescriptions. Functional ability and status Nutritional status Physical activity Advanced directives List of other physicians Hospitalizations, surgeries, and ER visits in previous 12 months Vitals Screenings to include cognitive, depression, and falls Referrals and appointments  In addition, I have reviewed and discussed with patient certain preventive protocols, quality metrics, and best practice recommendations. A written personalized care plan for preventive services as well as general preventive health recommendations were provided to patient.     Veleria Barnhardt L Motley, LPN   1/61/0960     I have reviewed the above information and agree with above.   Duncan Dull, MD

## 2022-11-21 NOTE — Patient Instructions (Addendum)
Brenda Carr , Thank you for taking time to come for your Medicare Wellness Visit. I appreciate your ongoing commitment to your health goals. Please review the following plan we discussed and let me know if I can assist you in the future.   These are the goals we discussed:  Goals      Increase physical activity     Add resistance training      Maintain Healthy Lifestyle     Stay active Healthy diet        This is a list of the screening recommended for you and due dates:  Health Maintenance  Topic Date Due   Hepatitis C Screening: USPSTF Recommendation to screen - Ages 21-79 yo.  Never done   Pneumonia Vaccine (1 of 1 - PCV) 01/07/2023*   Zoster (Shingles) Vaccine (1 of 2) 02/20/2023*   Flu Shot  03/09/2023   Colon Cancer Screening  06/12/2023   Medicare Annual Wellness Visit  11/21/2023   Mammogram  02/10/2024   DTaP/Tdap/Td vaccine (2 - Td or Tdap) 08/10/2032   DEXA scan (bone density measurement)  Completed   HPV Vaccine  Aged Out   COVID-19 Vaccine  Discontinued  *Topic was postponed. The date shown is not the original due date.    Advanced directives: End of life planning; Advance aging; Advanced directives discussed.  Copy of current HCPOA/Living Will requested.    Conditions/risks identified: none new  Next appointment: Follow up in one year for your annual wellness visit    Preventive Care 65 Years and Older, Female Preventive care refers to lifestyle choices and visits with your health care provider that can promote health and wellness. What does preventive care include? A yearly physical exam. This is also called an annual well check. Dental exams once or twice a year. Routine eye exams. Ask your health care provider how often you should have your eyes checked. Personal lifestyle choices, including: Daily care of your teeth and gums. Regular physical activity. Eating a healthy diet. Avoiding tobacco and drug use. Limiting alcohol use. Practicing safe  sex. Taking low-dose aspirin every day. Taking vitamin and mineral supplements as recommended by your health care provider. What happens during an annual well check? The services and screenings done by your health care provider during your annual well check will depend on your age, overall health, lifestyle risk factors, and family history of disease. Counseling  Your health care provider may ask you questions about your: Alcohol use. Tobacco use. Drug use. Emotional well-being. Home and relationship well-being. Sexual activity. Eating habits. History of falls. Memory and ability to understand (cognition). Work and work Astronomer. Reproductive health. Screening  You may have the following tests or measurements: Height, weight, and BMI. Blood pressure. Lipid and cholesterol levels. These may be checked every 5 years, or more frequently if you are over 4 years old. Skin check. Lung cancer screening. You may have this screening every year starting at age 71 if you have a 30-pack-year history of smoking and currently smoke or have quit within the past 15 years. Fecal occult blood test (FOBT) of the stool. You may have this test every year starting at age 44. Flexible sigmoidoscopy or colonoscopy. You may have a sigmoidoscopy every 5 years or a colonoscopy every 10 years starting at age 52. Hepatitis C blood test. Hepatitis B blood test. Sexually transmitted disease (STD) testing. Diabetes screening. This is done by checking your blood sugar (glucose) after you have not eaten for a while (fasting).  You may have this done every 1-3 years. Bone density scan. This is done to screen for osteoporosis. You may have this done starting at age 48. Mammogram. This may be done every 1-2 years. Talk to your health care provider about how often you should have regular mammograms. Talk with your health care provider about your test results, treatment options, and if necessary, the need for more  tests. Vaccines  Your health care provider may recommend certain vaccines, such as: Influenza vaccine. This is recommended every year. Tetanus, diphtheria, and acellular pertussis (Tdap, Td) vaccine. You may need a Td booster every 10 years. Zoster vaccine. You may need this after age 10. Pneumococcal 13-valent conjugate (PCV13) vaccine. One dose is recommended after age 40. Pneumococcal polysaccharide (PPSV23) vaccine. One dose is recommended after age 8. Talk to your health care provider about which screenings and vaccines you need and how often you need them. This information is not intended to replace advice given to you by your health care provider. Make sure you discuss any questions you have with your health care provider. Document Released: 08/21/2015 Document Revised: 04/13/2016 Document Reviewed: 05/26/2015 Elsevier Interactive Patient Education  2017 [place] Prevention in the Home Falls can cause injuries. They can happen to people of all ages. There are many things you can do to make your home safe and to help prevent falls. What can I do on the outside of my home? Regularly fix the edges of walkways and driveways and fix any cracks. Remove anything that might make you trip as you walk through a door, such as a raised step or threshold. Trim any bushes or trees on the path to your home. Use bright outdoor lighting. Clear any walking paths of anything that might make someone trip, such as rocks or tools. Regularly check to see if handrails are loose or broken. Make sure that both sides of any steps have handrails. Any raised decks and porches should have guardrails on the edges. Have any leaves, snow, or ice cleared regularly. Use sand or salt on walking paths during winter. Clean up any spills in your garage right away. This includes oil or grease spills. What can I do in the bathroom? Use night lights. Install grab bars by the toilet and in the tub and shower.  Do not use towel bars as grab bars. Use non-skid mats or decals in the tub or shower. If you need to sit down in the shower, use a plastic, non-slip stool. Keep the floor dry. Clean up any water that spills on the floor as soon as it happens. Remove soap buildup in the tub or shower regularly. Attach bath mats securely with double-sided non-slip rug tape. Do not have throw rugs and other things on the floor that can make you trip. What can I do in the bedroom? Use night lights. Make sure that you have a light by your bed that is easy to reach. Do not use any sheets or blankets that are too big for your bed. They should not hang down onto the floor. Have a firm chair that has side arms. You can use this for support while you get dressed. Do not have throw rugs and other things on the floor that can make you trip. What can I do in the kitchen? Clean up any spills right away. Avoid walking on wet floors. Keep items that you use a lot in easy-to-reach places. If you need to reach something above you, use a  strong step stool that has a grab bar. Keep electrical cords out of the way. Do not use floor polish or wax that makes floors slippery. If you must use wax, use non-skid floor wax. Do not have throw rugs and other things on the floor that can make you trip. What can I do with my stairs? Do not leave any items on the stairs. Make sure that there are handrails on both sides of the stairs and use them. Fix handrails that are broken or loose. Make sure that handrails are as long as the stairways. Check any carpeting to make sure that it is firmly attached to the stairs. Fix any carpet that is loose or worn. Avoid having throw rugs at the top or bottom of the stairs. If you do have throw rugs, attach them to the floor with carpet tape. Make sure that you have a light switch at the top of the stairs and the bottom of the stairs. If you do not have them, ask someone to add them for you. What else  can I do to help prevent falls? Wear shoes that: Do not have high heels. Have rubber bottoms. Are comfortable and fit you well. Are closed at the toe. Do not wear sandals. If you use a stepladder: Make sure that it is fully opened. Do not climb a closed stepladder. Make sure that both sides of the stepladder are locked into place. Ask someone to hold it for you, if possible. Clearly mark and make sure that you can see: Any grab bars or handrails. First and last steps. Where the edge of each step is. Use tools that help you move around (mobility aids) if they are needed. These include: Canes. Walkers. Scooters. Crutches. Turn on the lights when you go into a dark area. Replace any light bulbs as soon as they burn out. Set up your furniture so you have a clear path. Avoid moving your furniture around. If any of your floors are uneven, fix them. If there are any pets around you, be aware of where they are. Review your medicines with your doctor. Some medicines can make you feel dizzy. This can increase your chance of falling. Ask your doctor what other things that you can do to help prevent falls. This information is not intended to replace advice given to you by your health care provider. Make sure you discuss any questions you have with your health care provider. Document Released: 05/21/2009 Document Revised: 12/31/2015 Document Reviewed: 08/29/2014 Elsevier Interactive Patient Education  2017 [employer].

## 2022-11-26 ENCOUNTER — Other Ambulatory Visit: Payer: Self-pay | Admitting: Internal Medicine

## 2023-01-12 DIAGNOSIS — H02834 Dermatochalasis of left upper eyelid: Secondary | ICD-10-CM | POA: Diagnosis not present

## 2023-01-12 DIAGNOSIS — H02831 Dermatochalasis of right upper eyelid: Secondary | ICD-10-CM | POA: Diagnosis not present

## 2023-01-16 ENCOUNTER — Ambulatory Visit (INDEPENDENT_AMBULATORY_CARE_PROVIDER_SITE_OTHER): Payer: No Typology Code available for payment source | Admitting: Internal Medicine

## 2023-01-16 ENCOUNTER — Encounter: Payer: Self-pay | Admitting: Internal Medicine

## 2023-01-16 VITALS — BP 115/80 | HR 78 | Temp 97.5°F | Ht 71.0 in | Wt 224.2 lb

## 2023-01-16 DIAGNOSIS — D126 Benign neoplasm of colon, unspecified: Secondary | ICD-10-CM

## 2023-01-16 DIAGNOSIS — E890 Postprocedural hypothyroidism: Secondary | ICD-10-CM | POA: Diagnosis not present

## 2023-01-16 DIAGNOSIS — Z1159 Encounter for screening for other viral diseases: Secondary | ICD-10-CM | POA: Diagnosis not present

## 2023-01-16 DIAGNOSIS — R921 Mammographic calcification found on diagnostic imaging of breast: Secondary | ICD-10-CM

## 2023-01-16 DIAGNOSIS — I10 Essential (primary) hypertension: Secondary | ICD-10-CM

## 2023-01-16 DIAGNOSIS — E782 Mixed hyperlipidemia: Secondary | ICD-10-CM | POA: Diagnosis not present

## 2023-01-16 MED ORDER — AMLODIPINE BESYLATE 5 MG PO TABS: 5.0000 mg | ORAL_TABLET | Freq: Every day | ORAL | 1 refills | Status: DC

## 2023-01-16 MED ORDER — LOSARTAN POTASSIUM 100 MG PO TABS: 100.0000 mg | ORAL_TABLET | Freq: Every day | ORAL | 1 refills | Status: DC

## 2023-01-16 NOTE — Progress Notes (Unsigned)
Subjective:  Patient ID: Brenda Carr, female    DOB: 09-Mar-1956  Age: 67 y.o. MRN: [id number]  CC: The primary encounter diagnosis was Primary hypertension. Diagnoses of Moderate mixed hyperlipidemia not requiring statin therapy, Postablative hypothyroidism, Need for hepatitis C screening test, White coat syndrome with hypertension, Serrated adenoma of colon, and Breast calcifications on mammogram were also pertinent to this visit.   HPI [name] presents for  Chief Complaint  Patient presents with   Medical Management of Chronic Issues    6 month follow up     1) left knee pain started a few days ago after several days of  increased physical exertion which included moving and  lifting 40 lb boxes of books and and moving furniture at home.  She recalls a twisting incident o avoid falling .  The pain and swelling has improved with  ice and rest . There is no giving way or bruising noted.     2) Hypertension: patient checks blood pressure twice weekly at home.  Readings have been for the most part<130/80 at rest  Patient is following a reduce salt diet most days and is taking amlodipine and losartan without fluid retention.  She has white coat hypertension   3) Hypothyroid:  she is using Armour thyroid by choice and has no symptoms of over or underactive thyroid    Outpatient Medications Prior to Visit  Medication Sig Dispense Refill   ARMOUR THYROID 90 MG tablet TAKE 1 TABLET DAILY (Patient taking differently: Pt takes 2 tablets on Sunday and Wednesday and 1 tablet all other days.) 90 tablet 1   amLODipine (NORVASC) 5 MG tablet TAKE 1 TABLET DAILY 90 tablet 0   losartan (COZAAR) 100 MG tablet TAKE 1 TABLET AT BEDTIME 90 tablet 0   No facility-administered medications prior to visit.    Review of Systems;  Patient denies headache, fevers, malaise, unintentional weight loss, skin rash, eye pain, sinus congestion and sinus pain, sore throat, dysphagia,  hemoptysis ,  cough, dyspnea, wheezing, chest pain, palpitations, orthopnea, edema, abdominal pain, nausea, melena, diarrhea, constipation, flank pain, dysuria, hematuria, urinary  Frequency, nocturia, numbness, tingling, seizures,  Focal weakness, Loss of consciousness,  Tremor, insomnia, depression, anxiety, and suicidal ideation.      Objective:  BP 115/80   Pulse 78   Temp (!) 97.5 F (36.4 C) (Oral)   Ht 5\' 11"  (1.803 m)   Wt 224 lb 3.2 oz (101.7 kg)   SpO2 97%   BMI 31.27 kg/m   BP Readings from Last 3 Encounters:  01/16/23 115/80  11/21/22 124/81  07/15/22 136/80    Wt Readings from Last 3 Encounters:  01/16/23 224 lb 3.2 oz (101.7 kg)  11/21/22 229 lb (103.9 kg)  07/15/22 229 lb 12.8 oz (104.2 kg)    Physical Exam Vitals reviewed.  Constitutional:      General: She is not in acute distress.    Appearance: Normal appearance. She is normal weight. She is not ill-appearing, toxic-appearing or diaphoretic.  HENT:     Head: Normocephalic.  Eyes:     General: No scleral icterus.       Right eye: No discharge.        Left eye: No discharge.     Conjunctiva/sclera: Conjunctivae normal.  Cardiovascular:     Rate and Rhythm: Normal rate and regular rhythm.     Heart sounds: Normal heart sounds.  Pulmonary:     Effort: Pulmonary effort is normal. No respiratory  distress.     Breath sounds: Normal breath sounds.  Musculoskeletal:        General: Tenderness and signs of injury present. No swelling or deformity. Normal range of motion.     Right lower leg: Normal. No edema.     Left lower leg: Tenderness and bony tenderness present. No swelling. No edema.       Legs:     Comments: Medial tenderness,  without effusion   Skin:    General: Skin is warm and dry.  Neurological:     General: No focal deficit present.     Mental Status: She is alert and oriented to person, place, and time. Mental status is at baseline.  Psychiatric:        Mood and Affect: Mood normal.         Behavior: Behavior normal.        Thought Content: Thought content normal.        Judgment: Judgment normal.    Lab Results  Component Value Date   HGBA1C 5.4 07/12/2022    Lab Results  Component Value Date   CREATININE 0.69 07/12/2022   CREATININE 0.82 03/19/2021   CREATININE 0.75 03/29/2019    Lab Results  Component Value Date   WBC 6.4 07/12/2022   HGB 13.5 07/12/2022   HCT 39.9 07/12/2022   PLT 217.0 07/12/2022   GLUCOSE 89 07/12/2022   CHOL 201 (H) 07/12/2022   TRIG 166.0 (H) 07/12/2022   HDL 42.00 07/12/2022   LDLCALC 126 (H) 07/12/2022   ALT 15 07/12/2022   AST 16 07/12/2022   NA 140 07/12/2022   K 4.1 07/12/2022   CL 103 07/12/2022   CREATININE 0.69 07/12/2022   BUN 13 07/12/2022   CO2 31 07/12/2022   TSH 2.86 07/12/2022   HGBA1C 5.4 07/12/2022   MICROALBUR <0.7 07/12/2022    MM DIAG BREAST TOMO UNI LEFT  Result Date: 09/21/2022 CLINICAL DATA:  68 year old female for six-month follow-up of LEFT breast calcifications. EXAM: DIGITAL DIAGNOSTIC UNILATERAL LEFT MAMMOGRAM WITH TOMOSYNTHESIS TECHNIQUE: Left digital diagnostic mammography and breast tomosynthesis was performed. COMPARISON:  Previous exam(s). ACR Breast Density Category b: There are scattered areas of fibroglandular density. FINDINGS: Full field, spot compression and magnification views of the LEFT breast demonstrate an unchanged 1.1 cm group of RETROAREOLAR LEFT breast calcifications. Other benign layering calcifications within the LEFT breast are again noted. IMPRESSION: Unchanged 1.1 cm group of likely benign RETROAREOLAR LEFT breast calcifications. Six-month follow-up recommended to ensure 1 year stability. RECOMMENDATION: Bilateral diagnostic mammogram with magnification views of the LEFT breast in 6 months. I have discussed the findings and recommendations with the patient. If applicable, a reminder letter will be sent to the patient regarding the next appointment. BI-RADS CATEGORY  3: Probably  benign. Electronically Signed   By: Harmon Pier M.D.   On: 09/21/2022 10:51   Assessment & Plan:  .Primary hypertension Assessment & Plan: Home readings are congruent using  2  machines and are similar to readings obtained here simultaneously.  All home readings have been  below 130/80 .  No changes today. Continue amlodipine 5 mg and losartan 100 mg at current doses Repeat assessment of GFR is pending   Lab Results  Component Value Date   CREATININE 0.69 07/12/2022   .  Orders: -     Comprehensive metabolic panel  Moderate mixed hyperlipidemia not requiring statin therapy  Postablative hypothyroidism Assessment & Plan: Managed with Armour thyroid due to history of intolerance to  Synthroid.  Thyroid function has been in the inormal range on current regimen of   180 mg   two times weekly,  90 mg all other days ; rechecking today   Lab Results  Component Value Date   TSH 2.86 07/12/2022     Orders: -     TSH  Need for hepatitis C screening test -     Hepatitis C antibody  White coat syndrome with hypertension Assessment & Plan: Home readings are congruent using  2  machines and are similar to readings obtained here simultaneously.  All home readings have been  below 130/80 .  No changes today. Continue amlodipine 5 mg and losartan 100 mg at current doses Repeat assessment of GFR is pending   Lab Results  Component Value Date   CREATININE 0.69 07/12/2022   .   Serrated adenoma of colon -     Ambulatory referral to Gastroenterology  Breast calcifications on mammogram Assessment & Plan: 6 month follow up diagnostic mammogram will be due in August 2024 and has been ordered   Orders: -     MM 3D DIAGNOSTIC MAMMOGRAM BILATERAL BREAST; Future  Other orders -     amLODIPine Besylate; Take 1 tablet (5 mg total) by mouth daily.  Dispense: 90 tablet; Refill: 1 -     Losartan Potassium; Take 1 tablet (100 mg total) by mouth at bedtime.  Dispense: 90 tablet; Refill:  1     Follow-up: Return for physical.   Sherlene Shams, MD

## 2023-01-16 NOTE — Assessment & Plan Note (Addendum)
Home readings are congruent using  2  machines and are similar to readings obtained here simultaneously.  All home readings have been  below 130/80 .  No changes today. Continue amlodipine 5 mg and losartan 100 mg at current doses Repeat assessment of GFR is pending   Lab Results  Component Value Date   CREATININE 0.69 07/12/2022   .

## 2023-01-17 ENCOUNTER — Telehealth: Payer: Self-pay

## 2023-01-17 ENCOUNTER — Other Ambulatory Visit: Payer: Self-pay

## 2023-01-17 DIAGNOSIS — D126 Benign neoplasm of colon, unspecified: Secondary | ICD-10-CM

## 2023-01-17 LAB — COMPREHENSIVE METABOLIC PANEL
ALT: 16 U/L (ref 0–35)
AST: 17 U/L (ref 0–37)
Albumin: 4.3 g/dL (ref 3.5–5.2)
Alkaline Phosphatase: 70 U/L (ref 39–117)
BUN: 14 mg/dL (ref 6–23)
CO2: 28 mEq/L (ref 19–32)
Calcium: 9.7 mg/dL (ref 8.4–10.5)
Chloride: 103 mEq/L (ref 96–112)
Creatinine, Ser: 0.77 mg/dL (ref 0.40–1.20)
GFR: 79.54 mL/min (ref >60.00)
Glucose, Bld: 85 mg/dL (ref 70–99)
Potassium: 4.5 mEq/L (ref 3.5–5.1)
Sodium: 138 mEq/L (ref 135–145)
Total Bilirubin: 1.5 mg/dL — ABNORMAL HIGH (ref 0.2–1.2)
Total Protein: 7.5 g/dL (ref 6.0–8.3)

## 2023-01-17 LAB — TSH: TSH: 1.54 u[IU]/mL (ref 0.35–5.50)

## 2023-01-17 LAB — HEPATITIS C ANTIBODY: Hepatitis C Ab: NONREACTIVE

## 2023-01-17 MED ORDER — NA SULFATE-K SULFATE-MG SULF 17.5-3.13-1.6 GM/177ML PO SOLN: 1.0000 | Freq: Once | ORAL | 0 refills | Status: AC

## 2023-01-17 NOTE — Assessment & Plan Note (Signed)
Managed with Armour thyroid due to history of intolerance to Synthroid.  Thyroid function has been in the inormal range on current regimen of   180 mg   two times weekly,  90 mg all other days ; rechecking today   Lab Results  Component Value Date   TSH 2.86 07/12/2022

## 2023-01-17 NOTE — Assessment & Plan Note (Signed)
6 month follow up diagnostic mammogram will be due in August 2024 and has been ordered

## 2023-01-17 NOTE — Telephone Encounter (Signed)
Patient called and left a voicemail she was returning your call to schedule her colonoscopy

## 2023-01-17 NOTE — Telephone Encounter (Signed)
Gastroenterology Pre-Procedure Review  Request Date: 07/07/23 Requesting Physician: Dr. Servando Snare  PATIENT REVIEW QUESTIONS: The patient responded to the following health history questions as indicated:    1. Are you having any GI issues? no 2. Do you have a personal history of Polyps? yes (last colonoscopy was with Dr. Servando Snare on 06/11/2018 ) 3. Do you have a family history of Colon Cancer or Polyps? yes (grandmother colon cancer) 4. Diabetes Mellitus? no 5. Joint replacements in the past 12 months?no 6. Major health problems in the past 3 months?no 7. Any artificial heart valves, MVP, or defibrillator?no    MEDICATIONS & ALLERGIES:    Patient reports the following regarding taking any anticoagulation/antiplatelet therapy:   Plavix, Coumadin, Eliquis, Xarelto, Lovenox, Pradaxa, Brilinta, or Effient? no Aspirin? no  Patient confirms/reports the following medications:  Current Outpatient Medications  Medication Sig Dispense Refill   amLODipine (NORVASC) 5 MG tablet Take 1 tablet (5 mg total) by mouth daily. 90 tablet 1   ARMOUR THYROID 90 MG tablet TAKE 1 TABLET DAILY (Patient taking differently: Pt takes 2 tablets on Sunday and Wednesday and 1 tablet all other days.) 90 tablet 1   losartan (COZAAR) 100 MG tablet Take 1 tablet (100 mg total) by mouth at bedtime. 90 tablet 1   No current facility-administered medications for this visit.    Patient confirms/reports the following allergies:  Allergies  Allergen Reactions   Lamisil [Terbinafine Hcl] Other (See Comments)    Flu like symptoms    No orders of the defined types were placed in this encounter.   AUTHORIZATION INFORMATION Primary Insurance: 1D#: Group #:  Secondary Insurance: 1D#: Group #:  SCHEDULE INFORMATION: Date: 07/07/23 Time: Location: [facility]

## 2023-01-19 ENCOUNTER — Telehealth: Payer: Self-pay

## 2023-01-19 DIAGNOSIS — H02834 Dermatochalasis of left upper eyelid: Secondary | ICD-10-CM | POA: Diagnosis not present

## 2023-01-19 NOTE — Telephone Encounter (Signed)
Patients call has been returned.  Her colonoscopy has been rescheduled with Dr. Servando Snare from 07/07/23 to 07/14/23 at [facility].  [facility] has been notified of date change.  Referral updated. Instructions updated.  Thanks, [place], [place]

## 2023-01-19 NOTE — Telephone Encounter (Signed)
PT Left vmm to cancel colonoscopy on 07/07/2023 and would like a call back to reschedule

## 2023-02-08 ENCOUNTER — Other Ambulatory Visit: Payer: Self-pay | Admitting: Ophthalmology

## 2023-02-08 DIAGNOSIS — H532 Diplopia: Secondary | ICD-10-CM

## 2023-02-08 DIAGNOSIS — H35372 Puckering of macula, left eye: Secondary | ICD-10-CM | POA: Diagnosis not present

## 2023-02-08 DIAGNOSIS — E0500 Thyrotoxicosis with diffuse goiter without thyrotoxic crisis or storm: Secondary | ICD-10-CM | POA: Diagnosis not present

## 2023-02-08 DIAGNOSIS — H35379 Puckering of macula, unspecified eye: Secondary | ICD-10-CM | POA: Diagnosis not present

## 2023-02-15 ENCOUNTER — Ambulatory Visit
Admission: RE | Admit: 2023-02-15 | Discharge: 2023-02-15 | Disposition: A | Discharge status: Home / Self Care | Payer: No Typology Code available for payment source | Source: Ambulatory Visit | Attending: Ophthalmology | Admitting: Ophthalmology

## 2023-02-15 DIAGNOSIS — H532 Diplopia: Secondary | ICD-10-CM | POA: Insufficient documentation

## 2023-02-15 DIAGNOSIS — E0500 Thyrotoxicosis with diffuse goiter without thyrotoxic crisis or storm: Secondary | ICD-10-CM | POA: Diagnosis not present

## 2023-02-15 DIAGNOSIS — I771 Stricture of artery: Secondary | ICD-10-CM | POA: Diagnosis not present

## 2023-02-15 MED ORDER — GADOBUTROL 1 MMOL/ML IV SOLN
10.0000 mL | Freq: Once | INTRAVENOUS | Status: AC | PRN
  Administered 2023-02-15: 10 mL via INTRAVENOUS

## 2023-03-15 ENCOUNTER — Ambulatory Visit
Admission: RE | Admit: 2023-03-15 | Discharge: 2023-03-15 | Disposition: A | Discharge status: Home / Self Care | Payer: No Typology Code available for payment source | Source: Ambulatory Visit | Attending: Internal Medicine | Admitting: Internal Medicine

## 2023-03-15 DIAGNOSIS — R921 Mammographic calcification found on diagnostic imaging of breast: Secondary | ICD-10-CM | POA: Diagnosis not present

## 2023-03-15 DIAGNOSIS — R92323 Mammographic fibroglandular density, bilateral breasts: Secondary | ICD-10-CM | POA: Diagnosis not present

## 2023-03-16 ENCOUNTER — Other Ambulatory Visit: Payer: Self-pay | Admitting: Internal Medicine

## 2023-03-16 DIAGNOSIS — Z1231 Encounter for screening mammogram for malignant neoplasm of breast: Secondary | ICD-10-CM

## 2023-03-22 ENCOUNTER — Telehealth: Payer: Self-pay

## 2023-03-22 NOTE — Telephone Encounter (Signed)
Returned phone call to reschedule from 07/14/23 to 07/21/23 due to she has a speaking engagement at a womens conference.  Kim at [facility] has been asked to move procedure to 07/21/23.  Instructions updated.  Referral updated.  Thanks,  [place], [place]

## 2023-03-22 NOTE — Telephone Encounter (Signed)
Patient states she has left 3 message since she has talk to you last time about reschedule her colonoscopy. She wants to reschedule the colonoscopy from 07/14/2023 to a week earlier or if she has to a week later. She would like a call back. She states she left 3 messages on the main line [phone] she states. Informed her I was sorry but this is the first calling getting documented in her chart but I would get it to [facility] and let my office manager know.

## 2023-03-30 ENCOUNTER — Other Ambulatory Visit: Payer: Self-pay | Admitting: Internal Medicine

## 2023-03-30 ENCOUNTER — Telehealth: Payer: Self-pay

## 2023-03-30 DIAGNOSIS — E890 Postprocedural hypothyroidism: Secondary | ICD-10-CM

## 2023-03-30 NOTE — Telephone Encounter (Signed)
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  Also having other symptoms of hyperthyroidism.  Patient states she would like to have her TSH checked and would like to know if she can do this without having an appointment.  Patient states she is seeing an eye doctor for her eye symptoms.

## 2023-03-31 NOTE — Telephone Encounter (Signed)
Pt was placed on lab schedule for 04/05/23.

## 2023-04-05 ENCOUNTER — Other Ambulatory Visit (INDEPENDENT_AMBULATORY_CARE_PROVIDER_SITE_OTHER): Payer: No Typology Code available for payment source

## 2023-04-05 DIAGNOSIS — E890 Postprocedural hypothyroidism: Secondary | ICD-10-CM

## 2023-04-07 LAB — THYROID PEROXIDASE ANTIBODY: Thyroperoxidase Ab SerPl-aCnc: 1 [IU]/mL (ref <9)

## 2023-04-07 LAB — THYROGLOBULIN LEVEL: Thyroglobulin: 1.5 ng/mL — ABNORMAL LOW

## 2023-04-07 LAB — THYROGLOBULIN ANTIBODY: Thyroglobulin Ab: <1 [IU]/mL (ref <=1)

## 2023-04-07 LAB — TSH: TSH: 1.22 m[IU]/L (ref 0.40–4.50)

## 2023-04-15 ENCOUNTER — Other Ambulatory Visit: Payer: Self-pay | Admitting: Internal Medicine

## 2023-05-18 DIAGNOSIS — E0500 Thyrotoxicosis with diffuse goiter without thyrotoxic crisis or storm: Secondary | ICD-10-CM | POA: Diagnosis not present

## 2023-05-19 ENCOUNTER — Encounter: Payer: Self-pay | Admitting: Internal Medicine

## 2023-05-26 ENCOUNTER — Ambulatory Visit: Admit: 2023-05-26 | Payer: No Typology Code available for payment source | Admitting: Ophthalmology

## 2023-05-26 SURGERY — BLEPHAROPLASTY
Anesthesia: Monitor Anesthesia Care | Laterality: Bilateral

## 2023-06-07 DIAGNOSIS — M1612 Unilateral primary osteoarthritis, left hip: Secondary | ICD-10-CM | POA: Diagnosis not present

## 2023-06-18 ENCOUNTER — Other Ambulatory Visit: Payer: Self-pay | Admitting: Internal Medicine

## 2023-06-23 ENCOUNTER — Encounter: Payer: No Typology Code available for payment source | Admitting: Internal Medicine

## 2023-06-28 ENCOUNTER — Telehealth: Payer: Self-pay

## 2023-06-28 NOTE — Telephone Encounter (Signed)
The patient called in to cancel her colonoscopy and she will call back when she is ready to reschedule.

## 2023-06-29 ENCOUNTER — Telehealth: Payer: Self-pay

## 2023-06-29 NOTE — Telephone Encounter (Signed)
Returned phone call to patient to let her know her message was received and procedure scheduled with Dr. Servando Snare 07/20/23 at [facility] has been canceled.  Thanks,  [place], [place]

## 2023-06-30 ENCOUNTER — Encounter: Payer: Self-pay | Admitting: Internal Medicine

## 2023-06-30 DIAGNOSIS — Z79899 Other long term (current) drug therapy: Secondary | ICD-10-CM

## 2023-06-30 DIAGNOSIS — E0500 Thyrotoxicosis with diffuse goiter without thyrotoxic crisis or storm: Secondary | ICD-10-CM | POA: Diagnosis not present

## 2023-07-04 ENCOUNTER — Telehealth: Payer: Self-pay

## 2023-07-04 ENCOUNTER — Telehealth: Payer: Self-pay | Admitting: Internal Medicine

## 2023-07-04 DIAGNOSIS — E782 Mixed hyperlipidemia: Secondary | ICD-10-CM

## 2023-07-04 DIAGNOSIS — I10 Essential (primary) hypertension: Secondary | ICD-10-CM

## 2023-07-04 DIAGNOSIS — E890 Postprocedural hypothyroidism: Secondary | ICD-10-CM

## 2023-07-04 DIAGNOSIS — E669 Obesity, unspecified: Secondary | ICD-10-CM

## 2023-07-04 NOTE — Telephone Encounter (Signed)
Patient need lab orders.

## 2023-07-04 NOTE — Telephone Encounter (Signed)
noted 

## 2023-07-04 NOTE — Telephone Encounter (Signed)
Patient states she is taking Tepezza and one of the side effects is high blood sugar, so she would like to be sure we check that in her labs.

## 2023-07-04 NOTE — Telephone Encounter (Signed)
Patient states she has an appointment with Dr. Duncan Dull on 07/11/2023 and would like to have her labs before the appointment so they can discuss the results.  Patient states she would like to come in to have her labs drawn tomorrow.  I scheduled a lab visit for patient tomorrow morning, but we will need to have lab orders entered.

## 2023-07-04 NOTE — Telephone Encounter (Signed)
I have pended lab orders. Is there anything else that needs to be ordered?

## 2023-07-05 ENCOUNTER — Other Ambulatory Visit (INDEPENDENT_AMBULATORY_CARE_PROVIDER_SITE_OTHER): Payer: No Typology Code available for payment source

## 2023-07-05 ENCOUNTER — Encounter: Payer: No Typology Code available for payment source | Admitting: Internal Medicine

## 2023-07-05 DIAGNOSIS — I10 Essential (primary) hypertension: Secondary | ICD-10-CM | POA: Diagnosis not present

## 2023-07-05 DIAGNOSIS — E669 Obesity, unspecified: Secondary | ICD-10-CM

## 2023-07-05 DIAGNOSIS — E890 Postprocedural hypothyroidism: Secondary | ICD-10-CM | POA: Diagnosis not present

## 2023-07-05 DIAGNOSIS — E782 Mixed hyperlipidemia: Secondary | ICD-10-CM | POA: Diagnosis not present

## 2023-07-05 LAB — COMPREHENSIVE METABOLIC PANEL
ALT: 21 U/L (ref 0–35)
AST: 18 U/L (ref 0–37)
Albumin: 4.3 g/dL (ref 3.5–5.2)
Alkaline Phosphatase: 69 U/L (ref 39–117)
BUN: 17 mg/dL (ref 6–23)
CO2: 27 meq/L (ref 19–32)
Calcium: 9.5 mg/dL (ref 8.4–10.5)
Chloride: 105 meq/L (ref 96–112)
Creatinine, Ser: 0.79 mg/dL (ref 0.40–1.20)
GFR: 76.88 mL/min (ref >60.00)
Glucose, Bld: 92 mg/dL (ref 70–99)
Potassium: 4 meq/L (ref 3.5–5.1)
Sodium: 140 meq/L (ref 135–145)
Total Bilirubin: 1.4 mg/dL — ABNORMAL HIGH (ref 0.2–1.2)
Total Protein: 7.2 g/dL (ref 6.0–8.3)

## 2023-07-05 LAB — CBC WITH DIFFERENTIAL/PLATELET
Basophils Absolute: 0.1 10*3/uL (ref 0.0–0.1)
Basophils Relative: 1 % (ref 0.0–3.0)
Eosinophils Absolute: 0.2 10*3/uL (ref 0.0–0.7)
Eosinophils Relative: 3.8 % (ref 0.0–5.0)
HCT: 42 % (ref 36.0–46.0)
Hemoglobin: 13.9 g/dL (ref 12.0–15.0)
Lymphocytes Relative: 37.5 % (ref 12.0–46.0)
Lymphs Abs: 2.1 10*3/uL (ref 0.7–4.0)
MCHC: 33.1 g/dL (ref 30.0–36.0)
MCV: 90 fL (ref 78.0–100.0)
Monocytes Absolute: 0.5 10*3/uL (ref 0.1–1.0)
Monocytes Relative: 8.2 % (ref 3.0–12.0)
Neutro Abs: 2.8 10*3/uL (ref 1.4–7.7)
Neutrophils Relative %: 49.5 % (ref 43.0–77.0)
Platelets: 210 10*3/uL (ref 150.0–400.0)
RBC: 4.67 Mil/uL (ref 3.87–5.11)
RDW: 13.2 % (ref 11.5–15.5)
WBC: 5.7 10*3/uL (ref 4.0–10.5)

## 2023-07-05 LAB — LIPID PANEL
Cholesterol: 223 mg/dL — ABNORMAL HIGH (ref 0–200)
HDL: 39.4 mg/dL (ref >39.00)
LDL Cholesterol: 148 mg/dL — ABNORMAL HIGH (ref 0–99)
NonHDL: 183.85
Total CHOL/HDL Ratio: 6
Triglycerides: 180 mg/dL — ABNORMAL HIGH (ref 0.0–149.0)
VLDL: 36 mg/dL (ref 0.0–40.0)

## 2023-07-05 LAB — MICROALBUMIN / CREATININE URINE RATIO
Creatinine,U: 119.3 mg/dL
Microalb Creat Ratio: 0.6 mg/g (ref 0.0–30.0)
Microalb, Ur: <0.7 mg/dL (ref 0.0–1.9)

## 2023-07-05 LAB — HEMOGLOBIN A1C: Hgb A1c MFr Bld: 5.2 % (ref 4.6–6.5)

## 2023-07-05 LAB — TSH: TSH: 1.72 u[IU]/mL (ref 0.35–5.50)

## 2023-07-11 ENCOUNTER — Ambulatory Visit (INDEPENDENT_AMBULATORY_CARE_PROVIDER_SITE_OTHER): Payer: No Typology Code available for payment source | Admitting: Internal Medicine

## 2023-07-11 ENCOUNTER — Encounter: Payer: Self-pay | Admitting: Internal Medicine

## 2023-07-11 VITALS — BP 119/80 | HR 83 | Ht 71.0 in | Wt 221.2 lb

## 2023-07-11 DIAGNOSIS — E890 Postprocedural hypothyroidism: Secondary | ICD-10-CM | POA: Diagnosis not present

## 2023-07-11 DIAGNOSIS — M8580 Other specified disorders of bone density and structure, unspecified site: Secondary | ICD-10-CM

## 2023-07-11 DIAGNOSIS — E782 Mixed hyperlipidemia: Secondary | ICD-10-CM

## 2023-07-11 DIAGNOSIS — H5789 Other specified disorders of eye and adnexa: Secondary | ICD-10-CM

## 2023-07-11 DIAGNOSIS — I10 Essential (primary) hypertension: Secondary | ICD-10-CM | POA: Diagnosis not present

## 2023-07-11 DIAGNOSIS — Z0000 Encounter for general adult medical examination without abnormal findings: Secondary | ICD-10-CM

## 2023-07-11 DIAGNOSIS — H6123 Impacted cerumen, bilateral: Secondary | ICD-10-CM

## 2023-07-11 DIAGNOSIS — Z780 Asymptomatic menopausal state: Secondary | ICD-10-CM

## 2023-07-11 DIAGNOSIS — E079 Disorder of thyroid, unspecified: Secondary | ICD-10-CM

## 2023-07-11 MED ORDER — LOSARTAN POTASSIUM 100 MG PO TABS: 100.0000 mg | ORAL_TABLET | Freq: Every day | ORAL | 1 refills | Status: DC

## 2023-07-11 NOTE — Assessment & Plan Note (Signed)
10 yr risk using the AHA cardiac risk calculator is 10 to 14% depending on blood pressure reading  Lab Results  Component Value Date   CHOL 223 (H) 07/05/2023   HDL 39.40 07/05/2023   LDLCALC 148 (H) 07/05/2023   TRIG 180.0 (H) 07/05/2023   CHOLHDL 6 07/05/2023

## 2023-07-11 NOTE — Assessment & Plan Note (Signed)

## 2023-07-11 NOTE — Assessment & Plan Note (Signed)
Home readings are congruent using  2  machines and are similar to readings obtained here simultaneously.  All home readings have been  below 130/80 .  No changes today. Continue amlodipine 5 mg and losartan 100 mg at current doses Repeat assessment of GFR has been done;  GFR   is  normal   Lab Results  Component Value Date   CREATININE 0.79 07/05/2023   . Lab Results  Component Value Date   NA 140 07/05/2023   K 4.0 07/05/2023   CL 105 07/05/2023   CO2 27 07/05/2023

## 2023-07-11 NOTE — Assessment & Plan Note (Addendum)
Managed with Armour thyroid due to history of intolerance to Synthroid.  Thyroid function has been in the normal range on current regimen of   180 mg   two times weekly,  90 mg all other days .  Receiving Tepezza for management of thyroid eye disease fro ophthalmologist in [place]. Referring to [facility] endocrinology for management of hypothyroidism per patient request    Lab Results  Component Value Date   TSH 1.72 07/05/2023

## 2023-07-11 NOTE — Assessment & Plan Note (Signed)
T scores are -1.3 based on 2023 DEXA

## 2023-07-11 NOTE — Progress Notes (Addendum)
 Patient ID: Brenda Carr, female    DOB: Feb 20, 1956  Age: 67 y.o. MRN: [id number]  The patient is here for annual preventive examination and management of other chronic and acute problems.   The risk factors are reflected in the social history.   The roster of all physicians providing medical care to patient - is listed in the Snapshot section of the chart.   Activities of daily living:  The patient is 100% independent in all ADLs: dressing, toileting, feeding as well as independent mobility   Home safety : The patient has smoke detectors in the home. They wear seatbelts.  There are no unsecured firearms at home. There is no violence in the home.    There is no risks for hepatitis, STDs or HIV. There is no   history of blood transfusion. They have no travel history to infectious disease endemic areas of the world.   The patient has seen their dentist in the last six month. They have seen their eye doctor in the last year. The patinet  denies slight hearing difficulty with regard to whispered voices and some television programs.  They have deferred audiologic testing in the last year.  They do not  have excessive sun exposure. Discussed the need for sun protection: hats, long sleeves and use of sunscreen if there is significant sun exposure.    Diet: the importance of a healthy diet is discussed. They do have a healthy diet.   The benefits of regular aerobic exercise were discussed. The patient  exercises  3 to 5 days per week  for  60 minutes.    Depression screen: there are no signs or vegative symptoms of depression- irritability, change in appetite, anhedonia, sadness/tearfullness.   The following portions of the patient's history were reviewed and updated as appropriate: allergies, current medications, past family history, past medical history,  past surgical history, past social history  and problem list.   Visual acuity was not assessed per patient preference since the patient has  regular follow up with an  ophthalmologist. Hearing and body mass index were assessed and reviewed.    During the course of the visit the patient was educated and counseled about appropriate screening and preventive services including : fall prevention , diabetes screening, nutrition counseling, colorectal cancer screening, and recommended immunizations.    Chief Complaint:   Told she snores by children  .Brenda Carr Has no other symptoms of OSA   Thyroid  eye disease:  symptoms started in July .  Vision limited .  Ophthalmologist in [place] started Tepezza ,  has had one infusion , the first of 8 ,  no improvement yet .  Concerned about progression . Wonders if she should be followed by an endocrinologist.    Bilateral cerumen impaction:  periodic cleaning out needed     Review of Symptoms  Patient denies headache, fevers, malaise, unintentional weight loss, skin rash, eye pain, sinus congestion and sinus pain, sore throat, dysphagia,  hemoptysis , cough, dyspnea, wheezing, chest pain, palpitations, orthopnea, edema, abdominal pain, nausea, melena, diarrhea, constipation, flank pain, dysuria, hematuria, urinary  Frequency, nocturia, numbness, tingling, seizures,  Focal weakness, Loss of consciousness,  Tremor, insomnia, depression, anxiety, and suicidal ideation.    Physical Exam:  BP 119/80   Pulse 83   Ht 5' 11 (1.803 m)   Wt 221 lb 3.2 oz (100.3 kg)   SpO2 98%   BMI 30.85 kg/m    Physical Exam Vitals reviewed.  Constitutional:  General: She is not in acute distress.    Appearance: Normal appearance. She is well-developed and normal weight. She is not ill-appearing, toxic-appearing or diaphoretic.  HENT:     Head: Normocephalic.     Right Ear: Tympanic membrane, ear canal and external ear normal. There is no impacted cerumen.     Left Ear: Tympanic membrane, ear canal and external ear normal. There is no impacted cerumen.     Nose: Nose normal.     Mouth/Throat:     Mouth: Mucous  membranes are moist.     Pharynx: Oropharynx is clear.   Eyes:     General: No scleral icterus.       Right eye: No discharge.        Left eye: No discharge.     Conjunctiva/sclera: Conjunctivae normal.     Pupils: Pupils are equal, round, and reactive to light.   Neck:     Thyroid : No thyromegaly.     Vascular: No carotid bruit or JVD.   Cardiovascular:     Rate and Rhythm: Normal rate and regular rhythm.     Heart sounds: Normal heart sounds.  Pulmonary:     Effort: Pulmonary effort is normal. No respiratory distress.     Breath sounds: Normal breath sounds.  Chest:  Breasts:    Breasts are symmetrical.     Right: Normal. No swelling, inverted nipple, mass, nipple discharge, skin change or tenderness.     Left: Normal. No swelling, inverted nipple, mass, nipple discharge, skin change or tenderness.  Abdominal:     General: Bowel sounds are normal.     Palpations: Abdomen is soft. There is no mass.     Tenderness: There is no abdominal tenderness. There is no guarding or rebound.   Musculoskeletal:        General: Normal range of motion.     Cervical back: Normal range of motion and neck supple.  Lymphadenopathy:     Cervical: No cervical adenopathy.     Upper Body:     Right upper body: No supraclavicular, axillary or pectoral adenopathy.     Left upper body: No supraclavicular, axillary or pectoral adenopathy.   Skin:    General: Skin is warm and dry.   Neurological:     General: No focal deficit present.     Mental Status: She is alert and oriented to person, place, and time. Mental status is at baseline.   Psychiatric:        Mood and Affect: Mood normal.        Behavior: Behavior normal.        Thought Content: Thought content normal.        Judgment: Judgment normal.    Assessment and Plan: Encounter for preventive health examination Assessment & Plan: age appropriate education and counseling updated, referrals for preventative services and immunizations  addressed, dietary and smoking counseling addressed, most recent labs reviewed.  I have personally reviewed and have noted:   1) the patient's medical and social history 2) The pt's use of alcohol, tobacco, and illicit drugs 3) The patient's current medications and supplements 4) Functional ability including ADL's, fall risk, home safety risk, hearing and visual impairment 5) Diet and physical activities 6) Evidence for depression or mood disorder 7) The patient's height, weight, and BMI have been recorded in the chart  I have made referrals, and provided counseling and education based on review of the above    White coat syndrome with hypertension  Assessment & Plan: Home readings are congruent using  2  machines and are similar to readings obtained here simultaneously.  All home readings have been  below 130/80 .  No changes today. Continue amlodipine  5 mg and losartan  100 mg at current doses Repeat assessment of GFR has been done;  GFR   is  normal   Lab Results  Component Value Date   CREATININE 0.79 07/05/2023   . Lab Results  Component Value Date   NA 140 07/05/2023   K 4.0 07/05/2023   CL 105 07/05/2023   CO2 27 07/05/2023     Orders: -     Comprehensive metabolic panel with GFR; Future  Postablative hypothyroidism Assessment & Plan: Managed with Armour thyroid  due to history of intolerance to Synthroid.  Thyroid  function has been in the normal range on current regimen of   180 mg   two times weekly,  90 mg all other days .  Receiving Tepezza  for management of thyroid  eye disease fro ophthalmologist in [place]. Referring to [facility] endocrinology for management of hypothyroidism per patient request    Lab Results  Component Value Date   TSH 1.72 07/05/2023     Orders: -     Ambulatory referral to Endocrinology  Mixed hyperlipidemia Assessment & Plan: 10 yr risk using the AHA cardiac risk calculator is 10 to 14% depending on blood pressure reading  Lab Results   Component Value Date   CHOL 223 (H) 07/05/2023   HDL 39.40 07/05/2023   LDLCALC 148 (H) 07/05/2023   TRIG 180.0 (H) 07/05/2023   CHOLHDL 6 07/05/2023     Orders: -     Lipid Panel w/reflex Direct LDL; Future  Osteopenia after menopause Assessment & Plan: T scores are -1.3 based on 2023 DEXA    Thyroid  eye disease -     Ambulatory referral to Endocrinology  Bilateral hearing loss due to cerumen impaction Assessment & Plan: She requires periodic cerumen disimpaction .  Encouarged to try Debrox first, if no results, she can schedule a RN visit    Other orders -     Losartan  Potassium; Take 1 tablet (100 mg total) by mouth at bedtime.  Dispense: 90 tablet; Refill: 1    Return in about 6 months (around 01/09/2024).  Verneita LITTIE Kettering, MD

## 2023-07-11 NOTE — Patient Instructions (Addendum)
  Based on your fasting cholesterol and your concurrent history of hypertension ,  your 10 year risk of having some type of coronary event (including heart attack) is 10 to 14% , meaning that one of  every  9 or 10 women with the same medical statistics will have a heart attack in the next 10 years.     The [employer] recommends starting patients on moderate intensity statin therapy for people with this risk  to lower your risks of these events.  I would like you to consider starting a medication called simvastatin (generic for Zocor  to lower your risk of heart attacks and strokes. )The alternative is to try dietary modification, red yeast rice,  regular exercise and weight loss and repeat the lipids in 6 months, prior to your next visit .      The natural remedies for cholesterol have not been proven to reduce your risk for a heart attack.  Red Yeast Rice has not been proven either,  But does lower cholesterol, so if you want to try it , the dose is 600 mg twice daily in capsule form, available OTC.    Referral to [facility] is in progress

## 2023-07-20 ENCOUNTER — Telehealth: Payer: Self-pay

## 2023-07-20 NOTE — Telephone Encounter (Signed)
PA for Armour Thyroid is needed.

## 2023-07-21 ENCOUNTER — Ambulatory Visit: Admit: 2023-07-21 | Payer: No Typology Code available for payment source | Admitting: Gastroenterology

## 2023-07-21 SURGERY — COLONOSCOPY WITH PROPOFOL
Anesthesia: Choice

## 2023-07-25 NOTE — Telephone Encounter (Signed)
PA has been approved for 05/11/2023 to 08/08/2024.

## 2023-07-26 DIAGNOSIS — E0500 Thyrotoxicosis with diffuse goiter without thyrotoxic crisis or storm: Secondary | ICD-10-CM | POA: Diagnosis not present

## 2023-08-16 ENCOUNTER — Ambulatory Visit: Payer: No Typology Code available for payment source | Attending: Internal Medicine | Admitting: Audiologist

## 2023-08-16 DIAGNOSIS — H938X9 Other specified disorders of ear, unspecified ear: Secondary | ICD-10-CM | POA: Insufficient documentation

## 2023-08-16 DIAGNOSIS — H9193 Unspecified hearing loss, bilateral: Secondary | ICD-10-CM | POA: Diagnosis not present

## 2023-08-16 NOTE — Procedures (Signed)
  Outpatient Audiology and [facility] [address] [place], [place]  [address] [phone]  Ototoxic Monitoring Baseline Audiologic Evaluation   NAME: Brenda Carr     DOB:   03/23/1956      MRN: [id number]                                                                                     DATE: 08/16/2023     REFERENT: Marylynn STATUS: Outpatient DIAGNOSIS: Audiologic Evaluation for the Purpose of Ototoxic Monitoring   History: [name] was seen for an audiological evaluation. [name] is receiving a hearing evaluation to establish their baseline hearing thresholds before treatment with ototoxic mediation Tepezza .   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 She has a faint inconsistent cricket sounding tinnitus. No other otologic symptoms reported. The men in her family tend to get hearing loss. No other case history reported.   Results from today's evaluation will be used to measure changes in patient's hearing thresholds during and six months after ototoxic medication exposure.   Evaluation:  Otoscopy showed a clear view of the tympanic membranes, bilaterally Tympanometry results were consistent with normal middle ear function, bilaterally  Audiometric testing was completed using conventional and high frequency audiometry with supraural high frequency transducer. Speech Detection Thresholds were normal hearing 250-6kHz bilaterally sloping to slight loss at [facility] only. SRT 15dB in each ear. Word Recognition was excellent at elevated level.   Sensitive Range to Ototoxicity (SRO)  is 9 to 11kHz in the left ear and 8-10kHz in the right ear. three highest frequency thresholds below 100dB    Results:  The test results were reviewed with [name].  Today's baseline audiological evaluation showed essentially normal hearing bilaterally. The extreme high frequencies showed no response by 11kHz in the right ear and 12.5kHz in the left ear.      At future appointments only SRO threshold testing for each ear.   Recommendations: 1.   Return for audiological evaluation 09/20/2023 to monitor hearing. Rate of follow up will be determined based on progression from baseline in next evaluation. Ototoxic loss is most often preceded by a sharp tinnitus, return sooner if new tinnitus arises. 3.  Wear hearing protection with an NRR of at least 25dB whenever exposed to loud noise. Loud noise is any level above 85dB. For example if people are raising their voices to be heard, the volume is louder than 85dB.  4.  Wear headphones for only 60 minutes at a time, and never louder than 60% volume.   32 minutes spent testing and counseling on results.    Lauraine Ka  Audiologist, Au.D., CCC-A 08/16/2023  2:33 PM  Cc:  [place]

## 2023-08-18 DIAGNOSIS — E0500 Thyrotoxicosis with diffuse goiter without thyrotoxic crisis or storm: Secondary | ICD-10-CM | POA: Diagnosis not present

## 2023-08-19 ENCOUNTER — Other Ambulatory Visit: Payer: Self-pay | Admitting: Internal Medicine

## 2023-09-08 DIAGNOSIS — E0500 Thyrotoxicosis with diffuse goiter without thyrotoxic crisis or storm: Secondary | ICD-10-CM | POA: Diagnosis not present

## 2023-09-20 ENCOUNTER — Ambulatory Visit: Payer: No Typology Code available for payment source | Attending: Internal Medicine | Admitting: Audiologist

## 2023-09-20 DIAGNOSIS — H938X9 Other specified disorders of ear, unspecified ear: Secondary | ICD-10-CM

## 2023-09-20 DIAGNOSIS — H9193 Unspecified hearing loss, bilateral: Secondary | ICD-10-CM

## 2023-09-20 NOTE — Procedures (Signed)
  Outpatient Audiology and [facility] [address] [place], [place]  [address] [phone]  AUDIOLOGICAL  EVALUATION  NAME: Brenda Carr     DOB:   [birth year]      MRN: [id number]                                                                                     DATE: 09/20/2023     REFERENT: Sherlene Shams, MD STATUS: Outpatient DIAGNOSIS: Ototoxic Monitoring, Decreased Hearing   History: Brenda Carr was seen for her baseline last month.  Brenda Carr is receiving a hearing monitoring due to treatment with ototoxic mediation Tepezza.   Evaluation:  High frequency pure tone thresholds stable. Slight decrease from previous evaluation but not significant change.  Results:  The test results were reviewed with Brenda Carr. Hearing is relatively stable. Very slight decreases in hearing in both ears, less than 15dB. Not considered a significant change. Follow up scheduled. Audiogram explained to Brenda Carr.   Recommendations: Next SRO screening scheduled for March 19th at 4pm  15 minutes spent testing and counseling on results.   If you have any questions please feel free to contact me at [phone].  Ammie Ferrier Au.D.  Audiologist   09/20/2023  9:51 AM  Cc: Sherlene Shams, MD

## 2023-09-21 DIAGNOSIS — E0500 Thyrotoxicosis with diffuse goiter without thyrotoxic crisis or storm: Secondary | ICD-10-CM | POA: Diagnosis not present

## 2023-10-04 DIAGNOSIS — E0500 Thyrotoxicosis with diffuse goiter without thyrotoxic crisis or storm: Secondary | ICD-10-CM | POA: Diagnosis not present

## 2023-10-10 ENCOUNTER — Telehealth: Payer: Self-pay

## 2023-10-10 ENCOUNTER — Other Ambulatory Visit: Payer: Self-pay

## 2023-10-10 DIAGNOSIS — E890 Postprocedural hypothyroidism: Secondary | ICD-10-CM

## 2023-10-10 NOTE — Telephone Encounter (Signed)
 Lab orders requested to be sent to [facility] primary in [place]. This has been complete

## 2023-10-17 ENCOUNTER — Ambulatory Visit: Payer: Self-pay | Admitting: Internal Medicine

## 2023-10-17 NOTE — Telephone Encounter (Signed)
 Pt is scheduled to see Claris Che, NP tomorrow.

## 2023-10-17 NOTE — Telephone Encounter (Signed)
  Chief Complaint: possible medication side effects. Very thirsty, fatigue since starting medication Tepezza injections Symptoms: thirsty, fatigue , dull headache. Last week sx of dizziness, felt like passing out but did not. Last week confused/ difficulty thinking "straight" at times.  Frequency: few weeks ago  Pertinent Negatives: Patient denies chest pain no difficulty breathing no fever reported. No increased urination reported no dizziness now  Disposition: [] ED /[] Urgent Care (no appt availability in office) / [x] Appointment(In office/virtual)/ []  [facility]/ [] Home Care/ [] Refused Recommended Disposition /[] [facility]/ []  Follow-up with PCP Additional Notes:   No available appt today . Please advise if patient can come in to get blood sugar checked today. Appt scheduled for tomorrow . None available with PCP. Only sx today thirst and fatigue. Recommended if sx worsen go to UC /ED for evaluation. Next injection due for [facility] March 21.  Please advise if patient can be evaluated today . Patient would like a call back.       Copied from CRM [id number]. Topic: Clinical - Red Word Triage >> Oct 17, 2023 11:45 AM Fonda Kinder J wrote: Red Word that prompted transfer to Nurse Triage: Mental confusion/blood sugar Reason for Disposition  [1] Caller has medicine question about med NOT prescribed by PCP AND [2] triager unable to answer question (e.g., compatibility with other med, storage)  Answer Assessment - Initial Assessment Questions 1. NAME of MEDICINE: "What medicine(s) are you calling about?"     Tepezza 500 mg injections 2. QUESTION: "What is your question?" (e.g., double dose of medicine, side effect)     Can  Tepezza injections cause sx of high blood sugar? Can patient be tested ? 3. PRESCRIBER: "Who prescribed the medicine?" Reason: if prescribed by specialist, call should be referred to that group.     Historical provider 4. SYMPTOMS: "Do you have any symptoms?"  If Yes, ask: "What symptoms are you having?"  "How bad are the symptoms (e.g., mild, moderate, severe)     Very thirst , fatigue , dull headache , confused last week. Dizzy lightheaded last week  5. PREGNANCY:  "Is there any chance that you are pregnant?" "When was your last menstrual period?"     na  Protocols used: Medication Question Call-A-AH

## 2023-10-18 ENCOUNTER — Ambulatory Visit (INDEPENDENT_AMBULATORY_CARE_PROVIDER_SITE_OTHER): Admitting: Family

## 2023-10-18 ENCOUNTER — Encounter: Payer: Self-pay | Admitting: Family

## 2023-10-18 ENCOUNTER — Other Ambulatory Visit: Payer: Self-pay | Admitting: Family

## 2023-10-18 VITALS — BP 129/77 | HR 92 | Temp 97.7°F | Ht 71.0 in | Wt 220.8 lb

## 2023-10-18 DIAGNOSIS — I10 Essential (primary) hypertension: Secondary | ICD-10-CM | POA: Diagnosis not present

## 2023-10-18 DIAGNOSIS — E782 Mixed hyperlipidemia: Secondary | ICD-10-CM | POA: Diagnosis not present

## 2023-10-18 DIAGNOSIS — R631 Polydipsia: Secondary | ICD-10-CM | POA: Diagnosis not present

## 2023-10-18 DIAGNOSIS — E890 Postprocedural hypothyroidism: Secondary | ICD-10-CM

## 2023-10-18 LAB — TSH: TSH: 0.3 u[IU]/mL — ABNORMAL LOW (ref 0.35–5.50)

## 2023-10-18 LAB — COMPREHENSIVE METABOLIC PANEL
ALT: 24 U/L (ref 0–35)
AST: 22 U/L (ref 0–37)
Albumin: 4.4 g/dL (ref 3.5–5.2)
Alkaline Phosphatase: 59 U/L (ref 39–117)
BUN: 10 mg/dL (ref 6–23)
CO2: 29 meq/L (ref 19–32)
Calcium: 9.8 mg/dL (ref 8.4–10.5)
Chloride: 104 meq/L (ref 96–112)
Creatinine, Ser: 0.74 mg/dL (ref 0.40–1.20)
GFR: 82.99 mL/min (ref >60.00)
Glucose, Bld: 99 mg/dL (ref 70–99)
Potassium: 4.5 meq/L (ref 3.5–5.1)
Sodium: 138 meq/L (ref 135–145)
Total Bilirubin: 1.4 mg/dL — ABNORMAL HIGH (ref 0.2–1.2)
Total Protein: 7.6 g/dL (ref 6.0–8.3)

## 2023-10-18 LAB — HEMOGLOBIN A1C: Hgb A1c MFr Bld: 5.3 % (ref 4.6–6.5)

## 2023-10-18 LAB — LIPID PANEL
Cholesterol: 196 mg/dL (ref 0–200)
HDL: 45.5 mg/dL (ref >39.00)
LDL Cholesterol: 127 mg/dL — ABNORMAL HIGH (ref 0–99)
NonHDL: 150.02
Total CHOL/HDL Ratio: 4
Triglycerides: 115 mg/dL (ref 0.0–149.0)
VLDL: 23 mg/dL (ref 0.0–40.0)

## 2023-10-18 LAB — LDL CHOLESTEROL, DIRECT: Direct LDL: 134 mg/dL

## 2023-10-18 LAB — T3, FREE: T3, Free: 4.8 pg/mL — ABNORMAL HIGH (ref 2.3–4.2)

## 2023-10-18 LAB — T4, FREE: Free T4: 0.82 ng/dL (ref 0.60–1.60)

## 2023-10-18 MED ORDER — BLOOD GLUCOSE TEST VI STRP: 1.0000 | ORAL_STRIP | Freq: Every day | 0 refills | Status: AC

## 2023-10-18 MED ORDER — LANCETS MISC. MISC: 1.0000 | Freq: Every day | 0 refills | Status: AC

## 2023-10-18 MED ORDER — LANCET DEVICE MISC: 1.0000 | Freq: Every day | 0 refills | Status: AC

## 2023-10-18 MED ORDER — BLOOD GLUCOSE MONITORING SUPPL DEVI: 1.0000 | Freq: Every day | 0 refills | Status: DC

## 2023-10-18 NOTE — Patient Instructions (Signed)
 We will start with labs today.  I will certainly share these endocrinology.  Please let me know of any new concerns, symptoms.

## 2023-10-18 NOTE — Assessment & Plan Note (Addendum)
 Reassuring exam.  Patient has started Tepezza IV for thyroid eye disease.  She is concern today primarily for hyperglycemia which is potential side effect;  symptom onset does correlate with Tepezza infusion. Hyperglycemia , auditory impairment, and Fatigue ( side effect 12%).  HA  ( side effect 8%) .  Cognitive dysfunction described in postmarketing data. UptoDate.  Patient has upcoming appointment with endocrinology, Dr Roosevelt Locks. Will share labs obtained with Dr Roosevelt Locks for advice in regards to management.  Encouraged continued close follow-up with ophthalmology.  Consider ENT referral in the setting of hearing loss, tinnitus.

## 2023-10-18 NOTE — Progress Notes (Signed)
 Assessment & Plan:  Postablative hypothyroidism Assessment & Plan: Reassuring exam.  Patient has started Tepezza IV for thyroid eye disease.  She is concern today primarily for hyperglycemia which is potential side effect;  symptom onset does correlate with Tepezza infusion. Hyperglycemia , auditory impairment, and Fatigue ( side effect 12%).  HA  ( side effect 8%) .  Cognitive dysfunction described in postmarketing data. UptoDate.  Patient has upcoming appointment with endocrinology, Dr Roosevelt Locks. Will share labs obtained with Dr Roosevelt Locks for advice in regards to management.  Encouraged continued close follow-up with ophthalmology.  Consider ENT referral in the setting of hearing loss, tinnitus.  Orders: -     Hemoglobin A1c -     Comprehensive metabolic panel -     TSH -     T4, free -     T3, free -     Lipid panel -     LDL cholesterol, direct  Polydipsia -     Hemoglobin A1c -     Comprehensive metabolic panel -     Lipid panel -     LDL cholesterol, direct  White coat syndrome with hypertension -     Comprehensive metabolic panel  Mixed hyperlipidemia -     Lipid panel -     LDL cholesterol, direct     Return precautions given.   Risks, benefits, and alternatives of the medications and treatment plan prescribed today were discussed, and patient expressed understanding.   Education regarding symptom management and diagnosis given to patient on AVS either electronically or printed.  No follow-ups on file.  Rennie Plowman, FNP  Subjective:    Patient ID: Brenda Carr, female    DOB: [birth year], 68 y.o.   MRN: [id number]  CC: Brenda Carr is a 68 y.o. female who presents today for an acute visit.    HPI: She is here today for concern for high blood glucose after starting tepezza for treatment of thyroid eye disease.    Feeling more thirsty, fatigue, brain fog , onset after last infusion of tepezza.   Mild HA  HA is not worse HA of life . No vision  loss, fever, N, vomiting, CP, sob  She is worried about hearing loss , describes as 'rumbling', and bilateral tinnitus since starting tepezza.   No family h/o DM  She is a Runner, broadcasting/film/video           Following with audiology , last seen 08/16/23 ; baseline testing prior to starting Tepezza by ophthalmology. She had normal hearing bilaterally. 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  Compliant with Armour 90 mg.  History of whitecoat hypertension.  She monitors her blood pressure daily, today she obtained 129/77.    Allergies: Lamisil [terbinafine hcl] Current Outpatient Medications on File Prior to Visit  Medication Sig Dispense Refill   amLODipine (NORVASC) 5 MG tablet TAKE 1 TABLET DAILY 90 tablet 1   ARMOUR THYROID 90 MG tablet TAKE 1 TABLET DAILY 90 tablet 1   losartan (COZAAR) 100 MG tablet Take 1 tablet (100 mg total) by mouth at bedtime. 90 tablet 1   teprotumumab-trbw (TEPEZZA) 500 MG injection Inject into the vein.     No current facility-administered medications on file prior to visit.    Review of Systems  Constitutional:  Positive for  fatigue. Negative for chills and fever.  Respiratory:  Negative for cough.   Cardiovascular:  Negative for chest pain and palpitations.  Gastrointestinal:  Negative for nausea and vomiting.  Neurological:  Positive for headaches.      Objective:    BP 129/77   Pulse 92   Temp 97.7 F (36.5 C) (Oral)   Ht 5\' 11"  (1.803 m)   Wt 220 lb 12.8 oz (100.2 kg)   SpO2 98%   BMI 30.80 kg/m   BP Readings from Last 3 Encounters:  10/18/23 129/77  07/11/23 119/80  01/16/23 115/80   Wt Readings from Last 3 Encounters:  10/18/23 220 lb 12.8 oz (100.2 kg)  07/11/23 221 lb 3.2 oz (100.3 kg)  01/16/23 224 lb 3.2 oz (101.7 kg)     Physical Exam

## 2023-10-19 ENCOUNTER — Telehealth: Payer: Self-pay | Admitting: Family

## 2023-10-19 MED ORDER — ARMOUR THYROID 90 MG PO TABS: ORAL_TABLET | ORAL | Status: DC

## 2023-10-19 NOTE — Telephone Encounter (Signed)
-----   Message from [facility] sent at 10/19/2023 12:00 PM EDT ----- Hi, back off of armour ----- Message ----- From: Allegra Grana, FNP Sent: 10/18/2023  10:59 PM EDT To: Altamese [place], MD  Dr Roosevelt Locks,  Patient has upcoming appointment with you next month to establish care for post ablative hypothyroidism.  Recently started on Jordan and she came in today with concerns, primarily, for development of hypoglycemia.  She also endorsed headache, hearing changes, tinnitus which are also side effects of Tepezza.  A1c reassuring.  I will recommend a glucose monitor for her.  I went ahead and obtained thyroid studies which you had ordered.  She is currently compliant with Armour 90mg .  Please let me know if recommend any changes to Armour Thyroid prior to seeing her.

## 2023-10-19 NOTE — Telephone Encounter (Signed)
 Spoke to pt and she stated that she is taking the  90 mg daily except on Sun and Wed she takes two  90 mg tablets on Sunday and Wednesday she stated that she will stop one of the pills on Wednesday

## 2023-10-19 NOTE — Telephone Encounter (Signed)
 Call patient Please let her know that I collaborated with endocrinology.  He advised to decrease Armour dose.  As she is taking Armour 90 mg daily.  I would advise 1 day a week that she skips her dose. She would take a weekly total dose then of Armour 540 mg.

## 2023-10-23 NOTE — Telephone Encounter (Signed)
 Noted. Agree.

## 2023-10-25 ENCOUNTER — Ambulatory Visit: Payer: No Typology Code available for payment source | Attending: Internal Medicine | Admitting: Audiologist

## 2023-10-25 DIAGNOSIS — H9193 Unspecified hearing loss, bilateral: Secondary | ICD-10-CM | POA: Insufficient documentation

## 2023-10-25 DIAGNOSIS — H938X9 Other specified disorders of ear, unspecified ear: Secondary | ICD-10-CM | POA: Insufficient documentation

## 2023-10-26 ENCOUNTER — Encounter: Payer: Self-pay | Admitting: Family

## 2023-10-26 DIAGNOSIS — E0500 Thyrotoxicosis with diffuse goiter without thyrotoxic crisis or storm: Secondary | ICD-10-CM | POA: Diagnosis not present

## 2023-10-26 DIAGNOSIS — H532 Diplopia: Secondary | ICD-10-CM | POA: Diagnosis not present

## 2023-10-26 NOTE — Procedures (Signed)
  Outpatient Audiology and [facility] [address] [place], [place]  [address] [phone]  AUDIOLOGICAL  EVALUATION  NAME: Brenda Carr     DOB:   [birth year]      MRN: [id number]                                                                                     DATE: 10/26/2023     REFERENT: Sherlene Shams, MD STATUS: Outpatient DIAGNOSIS: Ototoxic Monitoring, Decreased Hearing    History: Brenda Carr was seen for her baseline in January. Monthly hearing screening so far has not shown a significant change in hearing.  Brenda Carr is receiving a hearing monitoring due to treatment with ototoxic mediation Tepezza.  Brenda Carr today feels she is struggling to hear more. The tinnitus is worse. 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 She is having other symptoms related to the medication   Evaluation:  High frequency pure tone thresholds stable. No significant change.   Results:  The test results were reviewed with Brenda Carr. She had not had a significant change, her hearing is stable. Increased stress and fatigue can cause tinnitus to spike. Increased tinnitus and fatigue will make it harder to hear in a noisy classroom, especially young children.    Recommendations: Talk with physician about medication. If she continues taking medication, schedule follow up in one month.    18 minutes spent testing and counseling on results.   If you have any questions please feel free to contact me at [phone].  Ammie Ferrier Stalnaker Au.D.  Audiologist   10/26/2023  9:06 AM  Cc: Sherlene Shams, MD

## 2023-10-27 DIAGNOSIS — E0500 Thyrotoxicosis with diffuse goiter without thyrotoxic crisis or storm: Secondary | ICD-10-CM | POA: Diagnosis not present

## 2023-11-06 ENCOUNTER — Other Ambulatory Visit: Payer: Self-pay

## 2023-11-06 ENCOUNTER — Telehealth: Payer: Self-pay

## 2023-11-06 DIAGNOSIS — D126 Benign neoplasm of colon, unspecified: Secondary | ICD-10-CM

## 2023-11-06 NOTE — Telephone Encounter (Signed)
 Gastroenterology Pre-Procedure Review   Request Date: 01/23/24 Requesting Physician: Dr. Servando Snare   PATIENT REVIEW QUESTIONS: The patient responded to the following health history questions as indicated:     1. Are you having any GI issues? no 2. Do you have a personal history of Polyps? yes (last colonoscopy was with Dr. Servando Snare on 06/11/2018 ) 3. Do you have a family history of Colon Cancer or Polyps? yes (grandmother colon cancer) 4. Diabetes Mellitus? no 5. Joint replacements in the past 12 months?no 6. Major health problems in the past 3 months?no 7. Any artificial heart valves, MVP, or defibrillator?no    MEDICATIONS & ALLERGIES:    Patient reports the following regarding taking any anticoagulation/antiplatelet therapy:   Plavix, Coumadin, Eliquis, Xarelto, Lovenox, Pradaxa, Brilinta, or Effient? no Aspirin? No  Patient confirms/reports the following medications:  Current Outpatient Medications  Medication Sig Dispense Refill   amLODipine (NORVASC) 5 MG tablet TAKE 1 TABLET DAILY 90 tablet 1   ARMOUR THYROID 90 MG tablet Take 90mg  Po 6 days per week, skip one day a week of 90mg  dose.     Blood Glucose Monitoring Suppl DEVI 1 each by Does not apply route daily. May substitute to any manufacturer covered by patient's insurance. 1 each 0   Glucose Blood (BLOOD GLUCOSE TEST STRIPS) STRP 1 each by In Vitro route daily. May substitute to any manufacturer covered by patient's insurance. 100 strip 0   Lancet Device MISC 1 each by Does not apply route daily. May substitute to any manufacturer covered by patient's insurance. 1 each 0   Lancets Misc. MISC 1 each by Does not apply route daily. May substitute to any manufacturer covered by patient's insurance. 100 each 0   losartan (COZAAR) 100 MG tablet Take 1 tablet (100 mg total) by mouth at bedtime. 90 tablet 1   teprotumumab-trbw (TEPEZZA) 500 MG injection Inject into the vein.     No current facility-administered medications for this visit.     Patient confirms/reports the following allergies:  Allergies  Allergen Reactions   Lamisil [Terbinafine Hcl] Other (See Comments)    Flu like symptoms    No orders of the defined types were placed in this encounter.   AUTHORIZATION INFORMATION Primary Insurance: 1D#: Group #:  Secondary Insurance: 1D#: Group #:  SCHEDULE INFORMATION: Date: 01/23/24 Time: Location: [facility]

## 2023-11-17 DIAGNOSIS — E0500 Thyrotoxicosis with diffuse goiter without thyrotoxic crisis or storm: Secondary | ICD-10-CM | POA: Diagnosis not present

## 2023-11-19 ENCOUNTER — Other Ambulatory Visit: Payer: Self-pay | Admitting: Internal Medicine

## 2023-11-22 ENCOUNTER — Ambulatory Visit: Payer: No Typology Code available for payment source

## 2023-11-22 VITALS — BP 108/73 | Ht 71.0 in | Wt 218.0 lb

## 2023-11-22 DIAGNOSIS — Z0000 Encounter for general adult medical examination without abnormal findings: Secondary | ICD-10-CM

## 2023-11-22 NOTE — Patient Instructions (Signed)
 Brenda Carr , Thank you for taking time to come for your Medicare Wellness Visit. I appreciate your ongoing commitment to your health goals. Please review the following plan we discussed and let me know if I can assist you in the future.   Referrals/Orders/Follow-Ups/Clinician Recommendations: Consider updating vaccines in the future.  This is a list of the screening recommended for you and due dates:  Health Maintenance  Topic Date Due   Zoster (Shingles) Vaccine (1 of 2) Never done   Pneumonia Vaccine (1 of 1 - PCV) Never done   Colon Cancer Screening  08/07/2024*   Flu Shot  03/08/2024   Mammogram  03/14/2024   Medicare Annual Wellness Visit  11/21/2024   DTaP/Tdap/Td vaccine (2 - Td or Tdap) 08/10/2032   DEXA scan (bone density measurement)  Completed   Hepatitis C Screening  Completed   HPV Vaccine  Aged Out   Meningitis B Vaccine  Aged Out   COVID-19 Vaccine  Discontinued  *Topic was postponed. The date shown is not the original due date.    Advanced directives: (Copy Requested) Please bring a copy of your health care power of attorney and living will to the office to be added to your chart at your convenience. You can mail to [facility] [address] [place], [place] [address] or email to [email or web address]  Next Medicare Annual Wellness Visit scheduled for next year: Yes 11/29/24@ 9:30

## 2023-11-22 NOTE — Progress Notes (Signed)
 Subjective:   Brenda Carr is a 68 y.o. who presents for a Medicare Wellness preventive visit.  Visit Complete: Virtual I connected with  Delbert Phenix on 11/22/23 by a audio enabled telemedicine application and verified that I am speaking with the correct person using two identifiers.  Patient Location: Home  Provider Location: Home Office  I discussed the limitations of evaluation and management by telemedicine. The patient expressed understanding and agreed to proceed.  Vital Signs: Because this visit was a virtual/telehealth visit, some criteria may be missing or patient reported. Any vitals not documented were not able to be obtained and vitals that have been documented are patient reported.  VideoDeclined- This patient declined Librarian, academic. Therefore the visit was completed with audio only.  Persons Participating in Visit: Patient.  AWV Questionnaire: No: Patient Medicare AWV questionnaire was not completed prior to this visit.  Cardiac Risk Factors include: advanced age (>74men, >57 women);dyslipidemia;hypertension;obesity (BMI >30kg/m2)     Objective:    Today's Vitals   11/22/23 1425  BP: 108/73  Weight: 218 lb (98.9 kg)  Height: 5\' 11"  (1.803 m)   Body mass index is 30.4 kg/m.     11/22/2023    2:42 PM 11/21/2022    3:50 PM 11/02/2021    8:39 AM 09/24/2021    3:55 PM 06/11/2018    7:56 AM 12/24/2017   12:51 PM  Advanced Directives  Does Patient Have a Medical Advance Directive? Yes Yes Yes No No No  Type of Estate agent of [place];Living will Healthcare Power of [place];Living will Healthcare Power of [place];Living will     Does patient want to make changes to medical advance directive?  No - Patient declined No - Patient declined     Copy of Healthcare Power of Attorney in Chart? No - copy requested No - copy requested No - copy requested     Would patient like information on creating a  medical advance directive?     No - Patient declined     Current Medications (verified) Outpatient Encounter Medications as of 11/22/2023  Medication Sig   amLODipine (NORVASC) 5 MG tablet TAKE 1 TABLET DAILY   ARMOUR THYROID 90 MG tablet Take 90mg  Po 6 days per week, skip one day a week of 90mg  dose.   Blood Glucose Monitoring Suppl DEVI 1 each by Does not apply route daily. May substitute to any manufacturer covered by patient's insurance.   losartan (COZAAR) 100 MG tablet Take 1 tablet (100 mg total) by mouth at bedtime.   teprotumumab-trbw (TEPEZZA) 500 MG injection Inject into the vein.   No facility-administered encounter medications on file as of 11/22/2023.    Allergies (verified) Lamisil [terbinafine hcl]   History: Past Medical History:  Diagnosis Date   GERD (gastroesophageal reflux disease)    Hypertension    Thyroid disease    graves disease. Thyroid  ablated.   Past Surgical History:  Procedure Laterality Date   CHOLECYSTECTOMY  2006   COLONOSCOPY WITH PROPOFOL N/A 06/11/2018   Procedure: COLONOSCOPY WITH BIOPSY;  Surgeon: Midge Minium, MD;  Location: [facility];  Service: Endoscopy;  Laterality: N/A;   POLYPECTOMY N/A 06/11/2018   Procedure: POLYPECTOMY;  Surgeon: Midge Minium, MD;  Location: [facility];  Service: Endoscopy;  Laterality: N/A;   Family History  Problem Relation Age of Onset   Anuerysm Mother 36       aortic    Early death Mother  Congestive Heart Failure Father 40   Hearing loss Father    Heart disease Father    Cancer - Colon Paternal Aunt 33       colon   Cancer Maternal Grandmother 64       colon    Early death Maternal Grandmother    Heart disease Maternal Grandfather    Cancer Paternal Grandmother    Mental illness Paternal Grandfather    Hearing loss Brother    Hypertension Brother    Hypertension Brother    Breast cancer Neg Hx    Social History   Socioeconomic History   Marital status: Married    Spouse  name: Not on file   Number of children: Not on file   Years of education: Not on file   Highest education level: Not on file  Occupational History   Not on file  Tobacco Use   Smoking status: Never   Smokeless tobacco: Never  Vaping Use   Vaping status: Never Used  Substance and Sexual Activity   Alcohol use: Yes    Comment: 3-4x/yr   Drug use: Never   Sexual activity: Not Currently  Other Topics Concern   Not on file  Social History Narrative   Not on file   Social Drivers of Health   Financial Resource Strain: Low Risk  (11/22/2023)   Overall Financial Resource Strain (CARDIA)    Difficulty of Paying Living Expenses: Not hard at all  Food Insecurity: No Food Insecurity (11/22/2023)   Hunger Vital Sign    Worried About Running Out of Food in the Last Year: Never true    Ran Out of Food in the Last Year: Never true  Transportation Needs: No Transportation Needs (11/22/2023)   PRAPARE - Administrator, Civil Service (Medical): No    Lack of Transportation (Non-Medical): No  Physical Activity: Inactive (11/22/2023)   Exercise Vital Sign    Days of Exercise per Week: 0 days    Minutes of Exercise per Session: 0 min  Stress: No Stress Concern Present (11/22/2023)   [employer] of Occupational Health - Occupational Stress Questionnaire    Feeling of Stress : Only a little  Social Connections: Moderately Integrated (11/22/2023)   Social Connection and Isolation Panel [NHANES]    Frequency of Communication with Friends and Family: More than three times a week    Frequency of Social Gatherings with Friends and Family: More than three times a week    Attends Religious Services: More than 4 times per year    Active Member of [employer] or Organizations: Yes    Attends Banker Meetings: More than 4 times per year    Marital Status: Widowed    Tobacco Counseling Counseling given: Not Answered    Clinical Intake:  Pre-visit preparation completed:  Yes  Pain : No/denies pain     BMI - recorded: 30.4 Nutritional Status: BMI > 30  Obese Nutritional Risks: None Diabetes: No  Lab Results  Component Value Date   HGBA1C 5.3 10/18/2023   HGBA1C 5.2 07/05/2023   HGBA1C 5.4 07/12/2022     How often do you need to have someone help you when you read instructions, pamphlets, or other written materials from your doctor or pharmacy?: 1 - Never  Interpreter Needed?: No  Information entered by :: R. Gustava Berland LPN   Activities of Daily Living     11/22/2023    2:27 PM  In your present state of health, do you  have any difficulty performing the following activities:  Hearing? 0  Vision? 0  Comment glasses  Difficulty concentrating or making decisions? 0  Walking or climbing stairs? 0  Dressing or bathing? 0  Doing errands, shopping? 0  Preparing Food and eating ? N  Using the Toilet? N  In the past six months, have you accidently leaked urine? Y  Do you have problems with loss of bowel control? N  Managing your Medications? N  Managing your Finances? N  Housekeeping or managing your Housekeeping? N    Patient Care Team: Thersia Flax, MD as PCP - General (Internal Medicine) Burnie Cartwright, RN as Registered Nurse Arlette Benders, RN (Inactive) as Registered Nurse  Indicate any recent Medical Services you may have received from other than [facility] providers in the past year (date may be approximate).     Assessment:   This is a routine wellness examination for Bonnye.  Hearing/Vision screen Hearing Screening - Comments:: No issues Vision Screening - Comments:: glasses   Goals Addressed             This Visit's Progress    Patient Stated       Wants to loss some weight, start back exercising and get vision back       Depression Screen     11/22/2023    2:36 PM 10/18/2023   12:50 PM 07/11/2023    3:10 PM 01/16/2023    2:27 PM 11/21/2022    3:51 PM 07/15/2022    1:38 PM 11/02/2021    8:37 AM  PHQ 2/9 Scores  PHQ  - 2 Score 0 0 0 0 0 0 0  PHQ- 9 Score 1          Fall Risk     11/22/2023    2:29 PM 10/18/2023   12:50 PM 07/11/2023    3:10 PM 01/16/2023    2:27 PM 11/21/2022    3:54 PM  Fall Risk   Falls in the past year? 0 0 0 0 0  Number falls in past yr: 0 0 0 0 0  Injury with Fall? 0 0 0 0 0  Risk for fall due to : No Fall Risks No Fall Risks No Fall Risks No Fall Risks   Follow up Falls prevention discussed;Falls evaluation completed Falls evaluation completed Falls evaluation completed Falls evaluation completed Falls evaluation completed;Falls prevention discussed    MEDICARE RISK AT HOME:  Medicare Risk at Home Any stairs in or around the home?: Yes If so, are there any without handrails?: No Home free of loose throw rugs in walkways, pet beds, electrical cords, etc?: Yes Adequate lighting in your home to reduce risk of falls?: Yes Life alert?: No Use of a cane, walker or w/c?: No Grab bars in the bathroom?: Yes Shower chair or bench in shower?: No Elevated toilet seat or a handicapped toilet?: No  TIMED UP AND GO:  Was the test performed?  No  Cognitive Function: 6CIT completed        11/22/2023    2:43 PM 11/21/2022    4:03 PM  6CIT Screen  What Year? 0 points 0 points  What month? 0 points 0 points  What time? 0 points 0 points  Count back from 20 0 points   Months in reverse 0 points 0 points  Repeat phrase 0 points   Total Score 0 points     Immunizations Immunization History  Administered Date(s) Administered   Hep A /  Hep B 08/23/2022, 10/03/2022   IPV 09/06/2022   Moderna Sars-Covid-2 Vaccination 09/30/2019, 12/04/2019   Tdap 08/10/2022   Typhoid Inactivated 09/06/2022    Screening Tests Health Maintenance  Topic Date Due   Zoster Vaccines- Shingrix (1 of 2) Never done   Pneumonia Vaccine 53+ Years old (1 of 1 - PCV) Never done   Medicare Annual Wellness (AWV)  11/21/2023   Colonoscopy  08/07/2024 (Originally 06/12/2023)   INFLUENZA VACCINE  03/08/2024    MAMMOGRAM  03/14/2024   DTaP/Tdap/Td (2 - Td or Tdap) 08/10/2032   DEXA SCAN  Completed   Hepatitis C Screening  Completed   HPV VACCINES  Aged Out   Meningococcal B Vaccine  Aged Out   COVID-19 Vaccine  Discontinued    Health Maintenance  Health Maintenance Due  Topic Date Due   Zoster Vaccines- Shingrix (1 of 2) Never done   Pneumonia Vaccine 40+ Years old (1 of 1 - PCV) Never done   Medicare Annual Wellness (AWV)  11/21/2023   Health Maintenance Items Addressed: Patient is scheduled for a colonoscopy 01/23/24. Patient declines vaccines at this time.  Additional Screening:  Vision Screening: Recommended annual ophthalmology exams for early detection of glaucoma and other disorders of the eye. Up to date [facility]  Dental Screening: Recommended annual dental exams for proper oral hygiene  Community Resource Referral / Chronic Care Management: CRR required this visit?  No   CCM required this visit?  No     Plan:     I have personally reviewed and noted the following in the patient's chart:   Medical and social history Use of alcohol, tobacco or illicit drugs  Current medications and supplements including opioid prescriptions. Patient is not currently taking opioid prescriptions. Functional ability and status Nutritional status Physical activity Advanced directives List of other physicians Hospitalizations, surgeries, and ER visits in previous 12 months Vitals Screenings to include cognitive, depression, and falls Referrals and appointments  In addition, I have reviewed and discussed with patient certain preventive protocols, quality metrics, and best practice recommendations. A written personalized care plan for preventive services as well as general preventive health recommendations were provided to patient.     Felicitas Horse, LPN   4/[id number]/[id number]   After Visit Summary: (MyChart) Due to this being a telephonic visit, the after visit summary with patients  personalized plan was offered to patient via MyChart   Notes: Nothing significant to report at this time.

## 2023-11-29 ENCOUNTER — Ambulatory Visit: Payer: No Typology Code available for payment source | Admitting: "Endocrinology

## 2023-12-08 DIAGNOSIS — E0500 Thyrotoxicosis with diffuse goiter without thyrotoxic crisis or storm: Secondary | ICD-10-CM | POA: Diagnosis not present

## 2024-01-08 ENCOUNTER — Encounter: Payer: Self-pay | Admitting: Internal Medicine

## 2024-01-10 NOTE — Telephone Encounter (Signed)
 Called mother and ask to her to have son call office to set up new patient appointment with Dr Madelon Scheuermann.

## 2024-01-11 DIAGNOSIS — E0500 Thyrotoxicosis with diffuse goiter without thyrotoxic crisis or storm: Secondary | ICD-10-CM | POA: Diagnosis not present

## 2024-01-11 DIAGNOSIS — H02531 Eyelid retraction right upper eyelid: Secondary | ICD-10-CM | POA: Diagnosis not present

## 2024-01-11 DIAGNOSIS — H532 Diplopia: Secondary | ICD-10-CM | POA: Diagnosis not present

## 2024-01-16 ENCOUNTER — Encounter: Payer: Self-pay | Admitting: Gastroenterology

## 2024-01-22 ENCOUNTER — Telehealth: Payer: Self-pay

## 2024-01-22 NOTE — Telephone Encounter (Signed)
 Call has been returned to patient to reschedule her colonoscopy due to she has the flu.  Colonoscopy has been rescheduled to 03/07/24 with Dr. Ole Berkeley.  Trish in Endo notified.  Referral updated.  Instructions updated.

## 2024-01-25 ENCOUNTER — Ambulatory Visit: Admitting: "Endocrinology

## 2024-01-26 ENCOUNTER — Other Ambulatory Visit: Payer: Self-pay | Admitting: Internal Medicine

## 2024-01-28 NOTE — Telephone Encounter (Signed)
 See conversation below from March. Pt never kept appt with Endo. Not sure if she should still be on this medication. I have also sent her  mychart message to r/s with Endo.  [name], MD to Brenda Rollene MATSU, FNP      10/19/23 12:00 PM Hi, back off of armour  Brenda Rollene MATSU, FNP to [name], MD     10/18/23 10:59 PM Result Note Dr Dartha, Patient has upcoming appointment with you next month to establish care for post ablative hypothyroidism.  Recently started on Tepezza  and she came in today with concerns, primarily, for development of hypoglycemia.  She also endorsed headache, hearing changes, tinnitus which are also side effects of Tepezza .  A1c reassuring.  I will recommend a glucose monitor for her.  I went ahead and obtained thyroid  studies which you had ordered.  She is currently compliant with Armour 90mg .  Please let me know if recommend any changes to Armour Thyroid  prior to seeing her.   Hemoglobin A1c; TSH; T4, free; T3, free

## 2024-01-31 ENCOUNTER — Ambulatory Visit

## 2024-01-31 ENCOUNTER — Ambulatory Visit: Admitting: Internal Medicine

## 2024-01-31 DIAGNOSIS — H6123 Impacted cerumen, bilateral: Secondary | ICD-10-CM | POA: Insufficient documentation

## 2024-01-31 NOTE — Assessment & Plan Note (Signed)
 She requires periodic cerumen disimpaction .  Encouarged to try Debrox first, if no results, she can schedule a RN visit

## 2024-02-05 NOTE — Telephone Encounter (Signed)
 Refilled by PCP on 01/29/24

## 2024-02-08 ENCOUNTER — Other Ambulatory Visit: Payer: Self-pay | Admitting: Internal Medicine

## 2024-02-12 ENCOUNTER — Other Ambulatory Visit: Payer: Self-pay | Admitting: Internal Medicine

## 2024-02-12 DIAGNOSIS — Z1231 Encounter for screening mammogram for malignant neoplasm of breast: Secondary | ICD-10-CM

## 2024-02-19 DIAGNOSIS — H532 Diplopia: Secondary | ICD-10-CM | POA: Diagnosis not present

## 2024-02-19 DIAGNOSIS — E0500 Thyrotoxicosis with diffuse goiter without thyrotoxic crisis or storm: Secondary | ICD-10-CM | POA: Diagnosis not present

## 2024-02-26 DIAGNOSIS — E0500 Thyrotoxicosis with diffuse goiter without thyrotoxic crisis or storm: Secondary | ICD-10-CM | POA: Diagnosis not present

## 2024-02-26 DIAGNOSIS — H532 Diplopia: Secondary | ICD-10-CM | POA: Diagnosis not present

## 2024-02-27 ENCOUNTER — Encounter: Payer: Self-pay | Admitting: Internal Medicine

## 2024-02-27 DIAGNOSIS — E079 Disorder of thyroid, unspecified: Secondary | ICD-10-CM

## 2024-02-29 NOTE — Telephone Encounter (Signed)
 Insurance will not cover it without a referral being submitted.

## 2024-03-07 ENCOUNTER — Ambulatory Visit: Admitting: Certified Registered"

## 2024-03-07 ENCOUNTER — Ambulatory Visit
Admission: RE | Admit: 2024-03-07 | Discharge: 2024-03-07 | Disposition: A | Discharge status: Home / Self Care | Attending: Gastroenterology | Admitting: Gastroenterology

## 2024-03-07 ENCOUNTER — Other Ambulatory Visit: Payer: Self-pay

## 2024-03-07 ENCOUNTER — Encounter: Payer: Self-pay | Admitting: Gastroenterology

## 2024-03-07 ENCOUNTER — Encounter
Admission: RE | Disposition: A | Discharge status: Home / Self Care | Payer: Self-pay | Source: Home / Self Care | Attending: Gastroenterology

## 2024-03-07 DIAGNOSIS — E039 Hypothyroidism, unspecified: Secondary | ICD-10-CM | POA: Diagnosis not present

## 2024-03-07 DIAGNOSIS — D125 Benign neoplasm of sigmoid colon: Secondary | ICD-10-CM | POA: Insufficient documentation

## 2024-03-07 DIAGNOSIS — Z1211 Encounter for screening for malignant neoplasm of colon: Secondary | ICD-10-CM | POA: Diagnosis not present

## 2024-03-07 DIAGNOSIS — K640 First degree hemorrhoids: Secondary | ICD-10-CM | POA: Diagnosis not present

## 2024-03-07 DIAGNOSIS — K635 Polyp of colon: Secondary | ICD-10-CM

## 2024-03-07 DIAGNOSIS — Z6829 Body mass index (BMI) 29.0-29.9, adult: Secondary | ICD-10-CM | POA: Insufficient documentation

## 2024-03-07 DIAGNOSIS — E66813 Obesity, class 3: Secondary | ICD-10-CM | POA: Insufficient documentation

## 2024-03-07 DIAGNOSIS — I10 Essential (primary) hypertension: Secondary | ICD-10-CM | POA: Diagnosis not present

## 2024-03-07 DIAGNOSIS — D126 Benign neoplasm of colon, unspecified: Secondary | ICD-10-CM

## 2024-03-07 DIAGNOSIS — K219 Gastro-esophageal reflux disease without esophagitis: Secondary | ICD-10-CM | POA: Insufficient documentation

## 2024-03-07 DIAGNOSIS — Z860101 Personal history of adenomatous and serrated colon polyps: Secondary | ICD-10-CM | POA: Diagnosis not present

## 2024-03-07 DIAGNOSIS — Z79899 Other long term (current) drug therapy: Secondary | ICD-10-CM | POA: Insufficient documentation

## 2024-03-07 DIAGNOSIS — Z79890 Hormone replacement therapy (postmenopausal): Secondary | ICD-10-CM | POA: Insufficient documentation

## 2024-03-07 DIAGNOSIS — Z860100 Personal history of colon polyps, unspecified: Secondary | ICD-10-CM

## 2024-03-07 HISTORY — PX: COLONOSCOPY: SHX5424

## 2024-03-07 HISTORY — PX: POLYPECTOMY: SHX149

## 2024-03-07 HISTORY — PX: HEMOSTASIS CLIP PLACEMENT: SHX6857

## 2024-03-07 SURGERY — COLONOSCOPY
Anesthesia: General

## 2024-03-07 MED ORDER — PROPOFOL 500 MG/50ML IV EMUL
INTRAVENOUS | Status: DC | PRN
  Administered 2024-03-07: 150 ug/kg/min via INTRAVENOUS
  Administered 2024-03-07 (×2): 50 mg via INTRAVENOUS

## 2024-03-07 MED ORDER — LIDOCAINE HCL (CARDIAC) PF 100 MG/5ML IV SOSY
PREFILLED_SYRINGE | INTRAVENOUS | Status: DC | PRN
  Administered 2024-03-07: 100 mg via INTRAVENOUS
  Administered 2024-03-07: 40 mg via INTRAVENOUS

## 2024-03-07 MED ORDER — SODIUM CHLORIDE 0.9 % IV SOLN: INTRAVENOUS | Status: DC

## 2024-03-07 NOTE — H&P (Signed)
 Brenda Copping, MD [facility] [address], [address] [place], [place] [address] [phone] Fax : [phone]  Primary Care Physician:  Brenda Verneita CROME, MD Primary Gastroenterologist:  Dr. Copping  Pre-Procedure History & Physical: HPI:  Brenda Carr is a 68 y.o. female is here for an colonoscopy.   Past Medical History:  Diagnosis Date   GERD (gastroesophageal reflux disease)    Hypertension    Thyroid  disease    graves disease. Thyroid   ablated.    Past Surgical History:  Procedure Laterality Date   CHOLECYSTECTOMY  2006   COLONOSCOPY WITH PROPOFOL  N/A 06/11/2018   Procedure: COLONOSCOPY WITH BIOPSY;  Surgeon: Carr Rogelia, MD;  Location: [facility];  Service: Endoscopy;  Laterality: N/A;   POLYPECTOMY N/A 06/11/2018   Procedure: POLYPECTOMY;  Surgeon: Carr Rogelia, MD;  Location: [facility];  Service: Endoscopy;  Laterality: N/A;    Prior to Admission medications   Medication Sig Start Date End Date Taking? Authorizing Provider  amLODipine  (NORVASC ) 5 MG tablet TAKE 1 TABLET DAILY 11/20/23  Yes Tullo, Teresa L, MD  ARMOUR THYROID  90 MG tablet TAKE 1 TABLET DAILY 01/29/24  Yes Tullo, Teresa L, MD  losartan  (COZAAR ) 100 MG tablet TAKE 1 TABLET AT BEDTIME 02/12/24  Yes Brenda Verneita CROME, MD  Blood Glucose Monitoring Suppl DEVI 1 each by Does not apply route daily. May substitute to any manufacturer covered by patient's insurance. 10/18/23   Dineen Rollene MATSU, FNP  teprotumumab -trbw (TEPEZZA ) 500 MG injection Inject into the vein. 07/21/23   [provider]    Allergies as of 11/06/2023 - Review Complete 10/18/2023  Allergen Reaction Noted   Lamisil  [terbinafine  hcl] Other (See Comments) 05/21/2019    Family History  Problem Relation Age of Onset   Anuerysm Mother 74       aortic    Early death Mother    Congestive Heart Failure Father 21   Hearing loss Father    Heart disease Father    Cancer - Colon Paternal Aunt 64       colon   Cancer  Maternal Grandmother 78       colon    Early death Maternal Grandmother    Heart disease Maternal Grandfather    Cancer Paternal Grandmother    Mental illness Paternal Grandfather    Hearing loss Brother    Hypertension Brother    Hypertension Brother    Breast cancer Neg Hx     Social History   Socioeconomic History   Marital status: Married    Spouse name: Not on file   Number of children: Not on file   Years of education: Not on file   Highest education level: Not on file  Occupational History   Not on file  Tobacco Use   Smoking status: Never   Smokeless tobacco: Never  Vaping Use   Vaping status: Never Used  Substance and Sexual Activity   Alcohol use: Yes    Comment: 3-4x/yr   Drug use: Never   Sexual activity: Not Currently  Other Topics Concern   Not on file  Social History Narrative   Not on file   Social Drivers of Health   Financial Resource Strain: Low Risk  (11/22/2023)   Overall Financial Resource Strain (CARDIA)    Difficulty of Paying Living Expenses: Not hard at all  Food Insecurity: No Food Insecurity (11/22/2023)   Hunger Vital Sign    Worried About Running Out of Food in the Last Year: Never  true    Ran Out of Food in the Last Year: Never true  Transportation Needs: No Transportation Needs (11/22/2023)   PRAPARE - Administrator, Civil Service (Medical): No    Lack of Transportation (Non-Medical): No  Physical Activity: Inactive (11/22/2023)   Exercise Vital Sign    Days of Exercise per Week: 0 days    Minutes of Exercise per Session: 0 min  Stress: No Stress Concern Present (11/22/2023)   [employer] of Occupational Health - Occupational Stress Questionnaire    Feeling of Stress : Only a little  Social Connections: Moderately Integrated (11/22/2023)   Social Connection and Isolation Panel    Frequency of Communication with Friends and Family: More than three times a week    Frequency of Social Gatherings with Friends and  Family: More than three times a week    Attends Religious Services: More than 4 times per year    Active Member of [employer] or Organizations: Yes    Attends Banker Meetings: More than 4 times per year    Marital Status: Widowed  Intimate Partner Violence: Not At Risk (11/22/2023)   Humiliation, Afraid, Rape, and Kick questionnaire    Fear of Current or Ex-Partner: No    Emotionally Abused: No    Physically Abused: No    Sexually Abused: No    Review of Systems: See HPI, otherwise negative ROS  Physical Exam: BP (!) 143/88   Pulse 85   Temp (!) 97.3 F (36.3 C) (Temporal)   Resp 20   Ht 5' 11 (1.803 m)   Wt 97.4 kg   SpO2 97%   BMI 29.96 kg/m  General:   Alert,  pleasant and cooperative in NAD Head:  Normocephalic and atraumatic. Neck:  Supple; no masses or thyromegaly. Lungs:  Clear throughout to auscultation.    Heart:  Regular rate and rhythm. Abdomen:  Soft, nontender and nondistended. Normal bowel sounds, without guarding, and without rebound.   Neurologic:  Alert and  oriented x4;  grossly normal neurologically.  Impression/Plan: Brenda Carr is here for an colonoscopy to be performed for a history of adenomatous polyps on 2020   Risks, benefits, limitations, and alternatives regarding  colonoscopy have been reviewed with the patient.  Questions have been answered.  All parties agreeable.   Brenda Copping, MD  03/07/2024, 9:53 AM

## 2024-03-07 NOTE — Anesthesia Postprocedure Evaluation (Signed)
 Anesthesia Post Note  Patient: Brenda Carr  Procedure(s) Performed: COLONOSCOPY POLYPECTOMY, INTESTINE  Patient location during evaluation: PACU Anesthesia Type: General Level of consciousness: awake Pain management: satisfactory to patient Vital Signs Assessment: post-procedure vital signs reviewed and stable Respiratory status: spontaneous breathing and nonlabored ventilation Cardiovascular status: blood pressure returned to baseline Anesthetic complications: no   There were no known notable events for this encounter.   Last Vitals:  Vitals:   03/07/24 1126 03/07/24 1134  BP:  127/76  Pulse: 74 72  Resp:  13  Temp:    SpO2: 99% 99%    Last Pain:  Vitals:   03/07/24 1135  TempSrc:   PainSc: 0-No pain                 VAN STAVEREN,Afton Mikelson

## 2024-03-07 NOTE — Op Note (Signed)
 [facility] Patient Name: Brenda Carr Procedure Date: 03/07/2024 10:33 AM MRN: [id number] Account #: [id number] Date of Birth: 05/31/1956 Admit Type: Outpatient Age: 68 Room: [facility] ENDO ROOM 4 Gender: Female Note Status: Finalized Instrument Name: Arvis [id number] Procedure:             Colonoscopy Indications:           High risk colon cancer surveillance: Personal history                         of colonic polyps Providers:             [name] MD, MD Medicines:             Propofol  per Anesthesia Complications:         No immediate complications. Procedure:             Pre-Anesthesia Assessment:                        - Prior to the procedure, a History and Physical was                         performed, and patient medications and allergies were                         reviewed. The patient's tolerance of previous                         anesthesia was also reviewed. The risks and benefits                         of the procedure and the sedation options and risks                         were discussed with the patient. All questions were                         answered, and informed consent was obtained. Prior                         Anticoagulants: The patient has taken no anticoagulant                         or antiplatelet agents. ASA Grade Assessment: II - A                         patient with mild systemic disease. After reviewing                         the risks and benefits, the patient was deemed in                         satisfactory condition to undergo the procedure.                        After obtaining informed consent, the colonoscope was                         passed under direct vision. Throughout  the procedure,                         the patient's blood pressure, pulse, and oxygen                         saturations were monitored continuously. The                         Colonoscope was introduced through  the anus and                         advanced to the the cecum, identified by appendiceal                         orifice and ileocecal valve. The colonoscopy was                         performed without difficulty. The patient tolerated                         the procedure well. The quality of the bowel                         preparation was excellent. Findings:      The perianal and digital rectal examinations were normal.      Two sessile polyps were found in the sigmoid colon. The polyps were 3 to       6 mm in size. These polyps were removed with a cold snare. Resection and       retrieval were complete. For hemostasis, one hemostatic clip was       successfully placed (MR conditional). Clip manufacturer: [employer]. There was no bleeding at the end of the procedure.      Non-bleeding internal hemorrhoids were found during retroflexion. The       hemorrhoids were Grade I (internal hemorrhoids that do not prolapse). Impression:            - Two 3 to 6 mm polyps in the sigmoid colon, removed                         with a cold snare. Resected and retrieved. Clip (MR                         conditional) was placed. Clip manufacturer: [employer].                        - Non-bleeding internal hemorrhoids. Recommendation:        - Discharge patient to home.                        - Resume previous diet.                        - Continue present medications.                        -  Repeat colonoscopy in 5 years for surveillance. Procedure Code(s):     --- Professional ---                        [phone], Colonoscopy, flexible; with removal of                         tumor(s), polyp(s), or other lesion(s) by snare                         technique Diagnosis Code(s):     --- Professional ---                        Z86.010, Personal history of colonic polyps                        D12.5, Benign neoplasm of sigmoid colon CPT copyright 2022  American Medical Association. All rights reserved. The codes documented in this report are preliminary and upon coder review may  be revised to meet current compliance requirements. [name] MD, MD 03/07/2024 11:00:40 AM This report has been signed electronically. Number of Addenda: 0 Note Initiated On: 03/07/2024 10:33 AM Scope Withdrawal Time: 0 hours 9 minutes 5 seconds  Total Procedure Duration: 0 hours 14 minutes 25 seconds  Estimated Blood Loss:  Estimated blood loss: none.      [facility]

## 2024-03-07 NOTE — Transfer of Care (Signed)
 Immediate Anesthesia Transfer of Care Note  Patient: Brenda Carr  Procedure(s) Performed: COLONOSCOPY POLYPECTOMY, INTESTINE  Patient Location: PACU  Anesthesia Type:General  Level of Consciousness: drowsy  Airway & Oxygen Therapy: Patient Spontanous Breathing  Post-op Assessment: Report given to RN and Post -op Vital signs reviewed and stable  Post vital signs: stable  Last Vitals:  Vitals Value Taken Time  BP 101/63 03/07/24 11:01  Temp    Pulse 74 03/07/24 11:01  Resp 18 03/07/24 11:01  SpO2 92 % 03/07/24 11:01  Vitals shown include unfiled device data.  Last Pain:  Vitals:   03/07/24 1100  TempSrc:   PainSc: 0-No pain         Complications: No notable events documented.

## 2024-03-07 NOTE — Anesthesia Preprocedure Evaluation (Signed)
 Anesthesia Evaluation  Patient identified by MRN, date of birth, ID band Patient awake    Reviewed: Allergy & Precautions, NPO status , Patient's Chart, lab work & pertinent test results  Airway Mallampati: II  TM Distance: >3 FB Neck ROM: full    Dental  (+) Teeth Intact   Pulmonary neg pulmonary ROS   Pulmonary exam normal breath sounds clear to auscultation       Cardiovascular hypertension, Pt. on medications negative cardio ROS Normal cardiovascular exam Rhythm:Regular Rate:Normal     Neuro/Psych negative neurological ROS  negative psych ROS   GI/Hepatic negative GI ROS, Neg liver ROS,GERD  Medicated,,  Endo/Other  negative endocrine ROSHypothyroidism  Class 3 obesity  Renal/GU negative Renal ROS  negative genitourinary   Musculoskeletal negative musculoskeletal ROS (+)    Abdominal  (+) + obese  Peds negative pediatric ROS (+)  Hematology negative hematology ROS (+)   Anesthesia Other Findings Past Medical History: No date: GERD (gastroesophageal reflux disease) No date: Hypertension No date: Thyroid  disease     Comment:  graves disease. Thyroid   ablated.  Past Surgical History: 2006: CHOLECYSTECTOMY 06/11/2018: COLONOSCOPY WITH PROPOFOL ; N/A     Comment:  Procedure: COLONOSCOPY WITH BIOPSY;  Surgeon: Jinny Carmine, MD;  Location: [facility];  Service:               Endoscopy;  Laterality: N/A; 06/11/2018: POLYPECTOMY; N/A     Comment:  Procedure: POLYPECTOMY;  Surgeon: Jinny Carmine, MD;                Location: [facility];  Service: Endoscopy;                Laterality: N/A;  BMI    Body Mass Index: 29.96 kg/m      Reproductive/Obstetrics negative OB ROS                              Anesthesia Physical Anesthesia Plan  ASA: 2  Anesthesia Plan: General   Post-op Pain Management:    Induction: Intravenous  PONV Risk Score and  Plan: Propofol  infusion and TIVA  Airway Management Planned: Natural Airway and Nasal Cannula  Additional Equipment:   Intra-op Plan:   Post-operative Plan:   Informed Consent: I have reviewed the patients History and Physical, chart, labs and discussed the procedure including the risks, benefits and alternatives for the proposed anesthesia with the patient or authorized representative who has indicated his/her understanding and acceptance.     Dental Advisory Given  Plan Discussed with: CRNA  Anesthesia Plan Comments:         Anesthesia Quick Evaluation

## 2024-03-11 ENCOUNTER — Ambulatory Visit: Payer: Self-pay | Admitting: Gastroenterology

## 2024-03-11 LAB — SURGICAL PATHOLOGY

## 2024-03-12 ENCOUNTER — Ambulatory Visit (INDEPENDENT_AMBULATORY_CARE_PROVIDER_SITE_OTHER): Admitting: "Endocrinology

## 2024-03-12 ENCOUNTER — Encounter: Payer: Self-pay | Admitting: "Endocrinology

## 2024-03-12 VITALS — BP 160/80 | HR 85 | Ht 71.0 in | Wt 221.0 lb

## 2024-03-12 DIAGNOSIS — H5789 Other specified disorders of eye and adnexa: Secondary | ICD-10-CM

## 2024-03-12 DIAGNOSIS — E890 Postprocedural hypothyroidism: Secondary | ICD-10-CM

## 2024-03-12 DIAGNOSIS — E079 Disorder of thyroid, unspecified: Secondary | ICD-10-CM

## 2024-03-12 LAB — T4, FREE: Free T4: 1.2 ng/dL (ref 0.8–1.8)

## 2024-03-12 LAB — T3, FREE: T3, Free: 6.9 pg/mL — ABNORMAL HIGH (ref 2.3–4.2)

## 2024-03-12 LAB — TSH: TSH: 0.38 m[IU]/L — ABNORMAL LOW (ref 0.40–4.50)

## 2024-03-12 NOTE — Progress Notes (Signed)
 Outpatient Endocrinology Note [name], MD  03/12/24   Brenda Carr 1955/12/09 [id number]  Referring Provider: Marylynn Verneita CROME, MD Primary Care Provider: Marylynn Verneita CROME, MD Subjective  No chief complaint on file.   Assessment & Plan  Diagnoses and all orders for this visit:  Postablative hypothyroidism -     TSH -     T4, free -     T3, free  Thyroid  eye disease -     TSH -     T4, free -     T3, free   SHAWNESE MAGNER is currently taking on armour thyroid  90 mg 5 days of the week, and 180 mg 2 days of the week.  Her insurance does not cover Armour, we discussed Cytomel as well. Patient was biochemically hyperthyroid on her last check.  Discussed the etiology for hyperthyroidism. Educated on thyroid  axis.  Recommend the following: Continue current dose.  Labs today Repeat labs in 3 months or sooner if symptoms of hyper or hypothyroidism develop.  Counseled on: -complications of untreated hyperthyroidism including atrial fibrillation, heart failure and osteoporosis -side effects of Methimazole including but not limited to allergic reaction, rash, bone marrow suppression, liver dysfunction and teratogenic potential -implications in pregnancy and breastfeeding -compliance and follow up needs    S/p Tepezza  in 2024 with Dr Odella, Toribio Del for thyroid  eye disease helped with proptosis and itching/tearing    Patient has an upcoming appointment with Andrena Nageotte at [facility] for strabismus, she is a pediatric ophthalmologist Patient requests an earlier appointment with her, sent an email to her to see if she can accommodate the patient earlier  I have reviewed current medications, nurse's notes, allergies, vital signs, past medical and surgical history, family medical history, and social history for this encounter. Counseled patient on symptoms, examination findings, lab findings, imaging results, treatment decisions and monitoring and prognosis. The  patient understood the recommendations and agrees with the treatment plan. All questions regarding treatment plan were fully answered.   Return in about 4 months (around 07/12/2024) for visit, labs today.   [name], MD  03/12/24   I have reviewed current medications, nurse's notes, allergies, vital signs, past medical and surgical history, family medical history, and social history for this encounter. Counseled patient on symptoms, examination findings, lab findings, imaging results, treatment decisions and monitoring and prognosis. The patient understood the recommendations and agrees with the treatment plan. All questions regarding treatment plan were fully answered.   History of Present Illness Brenda Carr is a 68 y.o. year old female who presents to our clinic with post ablative hypothyroidism.    History of Graves disease in 2000s s/p RAI ablation. History of Grave's disease as well, with a flare up needing Tepezza  in 2024 with Dr Odella, Toribio Del. She has been on armour thyroid .  Has an appt. with Andrena Nageotte in 08/2023 for strabismus/double vision. She is a Runner, broadcasting/film/video by profession.   Currently on armour thyroid  90 mg 5 days of the week, and 180 mg 2 days of the week.    Symptoms suggestive of HYPOTHYROIDISM:  Excess fatigue No weight gain No cold intolerance  No constipation  No  Symptoms suggestive of HYPERTHYROIDISM:  weight loss  No heat intolerance No hyperdefecation  No palpitations  No  Compressive symptoms:  dysphagia  No dysphonia  No positional dyspnea (especially with simultaneous arms elevation)  No  Smokes  No On biotin  No Personal history of head/neck surgery/irradiation  Yes, RAI ablation   Physical Exam  BP (!) 160/80   Pulse 85   Ht 5' 11 (1.803 m)   Wt 221 lb (100.2 kg)   SpO2 96%   BMI 30.82 kg/m  Constitutional: well developed, well nourished Head: normocephalic, atraumatic, no exophthalmos Eyes: sclera  anicteric, no redness Neck: no thyromegaly, no thyroid  tenderness; no nodules palpated Lungs: normal respiratory effort Neurology: alert and oriented, no fine hand tremor Skin: dry, no appreciable rashes Musculoskeletal: no appreciable defects Psychiatric: normal mood and affect  Allergies Allergies  Allergen Reactions   Lamisil  [Terbinafine  Hcl] Other (See Comments)    Flu like symptoms    Current Medications Patient's Medications  New Prescriptions   No medications on file  Previous Medications   AMLODIPINE  (NORVASC ) 5 MG TABLET    TAKE 1 TABLET DAILY   ARMOUR THYROID  90 MG TABLET    TAKE 1 TABLET DAILY   BLOOD GLUCOSE MONITORING SUPPL DEVI    1 each by Does not apply route daily. May substitute to any manufacturer covered by patient's insurance.   LOSARTAN  (COZAAR ) 100 MG TABLET    TAKE 1 TABLET AT BEDTIME   TEPROTUMUMAB -TRBW (TEPEZZA ) 500 MG INJECTION    Inject into the vein.  Modified Medications   No medications on file  Discontinued Medications   No medications on file    Past Medical History Past Medical History:  Diagnosis Date   GERD (gastroesophageal reflux disease)    Hypertension    Thyroid  disease    graves disease. Thyroid   ablated.    Past Surgical History Past Surgical History:  Procedure Laterality Date   CHOLECYSTECTOMY  2006   COLONOSCOPY N/A 03/07/2024   Procedure: COLONOSCOPY;  Surgeon: Jinny Carmine, MD;  Location[facility];  Service: Endoscopy;  Laterality: N/A;   COLONOSCOPY WITH PROPOFOL  N/A 06/11/2018   Procedure: COLONOSCOPY WITH BIOPSY;  Surgeon: Jinny Carmine, MD;  Location: [facility];  Service: Endoscopy;  Laterality: N/A;   HEMOSTASIS CLIP PLACEMENT  03/07/2024   Procedure: CONTROL OF HEMORRHAGE, GI TRACT, ENDOSCOPIC, BY CLIPPING OR OVERSEWING;  Surgeon: Jinny Carmine, MD;  Location: [facility] ENDOSCOPY;  Service: Endoscopy;;   POLYPECTOMY N/A 06/11/2018   Procedure: POLYPECTOMY;  Surgeon: Jinny Carmine, MD;  Location: [facility];  Service: Endoscopy;  Laterality: N/A;   POLYPECTOMY  03/07/2024   Procedure: POLYPECTOMY, INTESTINE;  Surgeon: Jinny Carmine, MD;  Location: [facility] ENDOSCOPY;  Service: Endoscopy;;    Family History family history includes Anuerysm (age of onset: 57) in her mother; Cancer in her paternal grandmother; Cancer (age of onset: 52) in her maternal grandmother; Cancer - Colon (age of onset: 49) in her paternal aunt; Congestive Heart Failure (age of onset: 75) in her father; Early death in her maternal grandmother and mother; Hearing loss in her brother and father; Heart disease in her father and maternal grandfather; Hypertension in her brother and brother; Mental illness in her paternal grandfather.  Social History Social History   Socioeconomic History   Marital status: Married    Spouse name: Not on file   Number of children: Not on file   Years of education: Not on file   Highest education level: Not on file  Occupational History   Not on file  Tobacco Use   Smoking status: Never   Smokeless tobacco: Never  Vaping Use   Vaping status: Never Used  Substance and Sexual Activity   Alcohol use: Yes    Comment: 3-4x/yr   Drug use: Never  Sexual activity: Not Currently  Other Topics Concern   Not on file  Social History Narrative   Not on file   Social Drivers of Health   Financial Resource Strain: Low Risk  (11/22/2023)   Overall Financial Resource Strain (CARDIA)    Difficulty of Paying Living Expenses: Not hard at all  Food Insecurity: No Food Insecurity (11/22/2023)   Hunger Vital Sign    Worried About Running Out of Food in the Last Year: Never true    Ran Out of Food in the Last Year: Never true  Transportation Needs: No Transportation Needs (11/22/2023)   PRAPARE - Administrator, Civil Service (Medical): No    Lack of Transportation (Non-Medical): No  Physical Activity: Inactive (11/22/2023)   Exercise Vital Sign    Days of Exercise per Week: 0 days     Minutes of Exercise per Session: 0 min  Stress: No Stress Concern Present (11/22/2023)   [employer] of Occupational Health - Occupational Stress Questionnaire    Feeling of Stress : Only a little  Social Connections: Moderately Integrated (11/22/2023)   Social Connection and Isolation Panel    Frequency of Communication with Friends and Family: More than three times a week    Frequency of Social Gatherings with Friends and Family: More than three times a week    Attends Religious Services: More than 4 times per year    Active Member of [employer] or Organizations: Yes    Attends Banker Meetings: More than 4 times per year    Marital Status: Widowed  Intimate Partner Violence: Not At Risk (11/22/2023)   Humiliation, Afraid, Rape, and Kick questionnaire    Fear of Current or Ex-Partner: No    Emotionally Abused: No    Physically Abused: No    Sexually Abused: No    Laboratory Investigations Lab Results  Component Value Date   TSH 0.30 (L) 10/18/2023   TSH 1.72 07/05/2023   TSH 1.22 04/05/2023   FREET4 0.82 10/18/2023     No results found for: TSI   No components found for: TRAB   Lab Results  Component Value Date   CHOL 196 10/18/2023   Lab Results  Component Value Date   HDL 45.50 10/18/2023   Lab Results  Component Value Date   LDLCALC 127 (H) 10/18/2023   Lab Results  Component Value Date   TRIG 115.0 10/18/2023   Lab Results  Component Value Date   CHOLHDL 4 10/18/2023   Lab Results  Component Value Date   CREATININE 0.74 10/18/2023   Lab Results  Component Value Date   GFR 82.99 10/18/2023      Component Value Date/Time   NA 138 10/18/2023 1316   K 4.5 10/18/2023 1316   CL 104 10/18/2023 1316   CO2 29 10/18/2023 1316   GLUCOSE 99 10/18/2023 1316   BUN 10 10/18/2023 1316   CREATININE 0.74 10/18/2023 1316   CALCIUM 9.8 10/18/2023 1316   PROT 7.6 10/18/2023 1316   PROT 7.1 11/21/2017 0948   ALBUMIN 4.4 10/18/2023 1316    ALBUMIN 4.7 11/21/2017 0948   AST 22 10/18/2023 1316   ALT 24 10/18/2023 1316   ALKPHOS 59 10/18/2023 1316   BILITOT 1.4 (H) 10/18/2023 1316   BILITOT 0.9 11/21/2017 0948      Latest Ref Rng & Units 10/18/2023    1:16 PM 07/05/2023    8:38 AM 01/16/2023    2:56 PM  BMP  Glucose 70 -  99 mg/dL 99  92  85   BUN 6 - 23 mg/dL 10  17  14    Creatinine 0.40 - 1.20 mg/dL 9.25  9.20  9.22   Sodium 135 - 145 mEq/L 138  140  138   Potassium 3.5 - 5.1 mEq/L 4.5  4.0  4.5   Chloride 96 - 112 mEq/L 104  105  103   CO2 19 - 32 mEq/L 29  27  28    Calcium 8.4 - 10.5 mg/dL 9.8  9.5  9.7        Component Value Date/Time   WBC 5.7 07/05/2023 0838   RBC 4.67 07/05/2023 0838   HGB 13.9 07/05/2023 0838   HCT 42.0 07/05/2023 0838   PLT 210.0 07/05/2023 0838   MCV 90.0 07/05/2023 0838   MCHC 33.1 07/05/2023 0838   RDW 13.2 07/05/2023 0838   LYMPHSABS 2.1 07/05/2023 0838   MONOABS 0.5 07/05/2023 0838   EOSABS 0.2 07/05/2023 0838   BASOSABS 0.1 07/05/2023 0838      Parts of this note may have been dictated using voice recognition software. There may be variances in spelling and vocabulary which are unintentional. Not all errors are proofread. Please notify the dino if any discrepancies are noted or if the meaning of any statement is not clear.

## 2024-03-15 ENCOUNTER — Ambulatory Visit: Payer: Self-pay | Admitting: "Endocrinology

## 2024-03-15 ENCOUNTER — Other Ambulatory Visit: Payer: Self-pay | Admitting: "Endocrinology

## 2024-03-15 DIAGNOSIS — E890 Postprocedural hypothyroidism: Secondary | ICD-10-CM

## 2024-03-22 DIAGNOSIS — Z008 Encounter for other general examination: Secondary | ICD-10-CM | POA: Diagnosis not present

## 2024-03-22 DIAGNOSIS — Z6829 Body mass index (BMI) 29.0-29.9, adult: Secondary | ICD-10-CM | POA: Diagnosis not present

## 2024-03-22 DIAGNOSIS — E663 Overweight: Secondary | ICD-10-CM | POA: Diagnosis not present

## 2024-03-22 DIAGNOSIS — M8580 Other specified disorders of bone density and structure, unspecified site: Secondary | ICD-10-CM | POA: Diagnosis not present

## 2024-03-22 DIAGNOSIS — R269 Unspecified abnormalities of gait and mobility: Secondary | ICD-10-CM | POA: Diagnosis not present

## 2024-04-05 ENCOUNTER — Encounter: Payer: Self-pay | Admitting: Internal Medicine

## 2024-04-12 ENCOUNTER — Ambulatory Visit
Admission: RE | Admit: 2024-04-12 | Discharge: 2024-04-12 | Disposition: A | Discharge status: Home / Self Care | Source: Ambulatory Visit | Attending: Internal Medicine | Admitting: Internal Medicine

## 2024-04-12 DIAGNOSIS — Z1231 Encounter for screening mammogram for malignant neoplasm of breast: Secondary | ICD-10-CM | POA: Diagnosis not present

## 2024-04-17 ENCOUNTER — Telehealth: Payer: Self-pay | Admitting: Internal Medicine

## 2024-04-17 ENCOUNTER — Other Ambulatory Visit: Payer: Self-pay | Admitting: Internal Medicine

## 2024-04-17 DIAGNOSIS — E079 Disorder of thyroid, unspecified: Secondary | ICD-10-CM

## 2024-04-17 DIAGNOSIS — E782 Mixed hyperlipidemia: Secondary | ICD-10-CM

## 2024-04-17 NOTE — Telephone Encounter (Signed)
 Lab order needed

## 2024-04-17 NOTE — Addendum Note (Signed)
 Addended by: MARYLYNN VERNEITA CROME on: 04/17/2024 04:35 PM   Modules accepted: Orders

## 2024-04-23 ENCOUNTER — Encounter: Payer: Self-pay | Admitting: Internal Medicine

## 2024-04-23 DIAGNOSIS — H9313 Tinnitus, bilateral: Secondary | ICD-10-CM

## 2024-04-25 ENCOUNTER — Other Ambulatory Visit: Payer: Self-pay | Admitting: Internal Medicine

## 2024-04-26 ENCOUNTER — Ambulatory Visit: Payer: Self-pay | Admitting: Internal Medicine

## 2024-04-26 ENCOUNTER — Telehealth: Payer: Self-pay

## 2024-04-26 ENCOUNTER — Other Ambulatory Visit: Payer: Self-pay

## 2024-04-26 ENCOUNTER — Other Ambulatory Visit

## 2024-04-26 DIAGNOSIS — E782 Mixed hyperlipidemia: Secondary | ICD-10-CM

## 2024-04-26 DIAGNOSIS — E890 Postprocedural hypothyroidism: Secondary | ICD-10-CM

## 2024-04-26 LAB — LIPID PANEL
Cholesterol: 189 mg/dL (ref 0–200)
HDL: 38.6 mg/dL — ABNORMAL LOW (ref >39.00)
LDL Cholesterol: 123 mg/dL — ABNORMAL HIGH (ref 0–99)
NonHDL: 150.85
Total CHOL/HDL Ratio: 5
Triglycerides: 138 mg/dL (ref 0.0–149.0)
VLDL: 27.6 mg/dL (ref 0.0–40.0)

## 2024-04-26 LAB — COMPREHENSIVE METABOLIC PANEL WITH GFR
ALT: 16 U/L (ref 0–35)
AST: 13 U/L (ref 0–37)
Albumin: 4.1 g/dL (ref 3.5–5.2)
Alkaline Phosphatase: 50 U/L (ref 39–117)
BUN: 26 mg/dL — ABNORMAL HIGH (ref 6–23)
CO2: 28 meq/L (ref 19–32)
Calcium: 9.7 mg/dL (ref 8.4–10.5)
Chloride: 103 meq/L (ref 96–112)
Creatinine, Ser: 0.82 mg/dL (ref 0.40–1.20)
GFR: 73.1 mL/min (ref >60.00)
Glucose, Bld: 97 mg/dL (ref 70–99)
Potassium: 4.7 meq/L (ref 3.5–5.1)
Sodium: 138 meq/L (ref 135–145)
Total Bilirubin: 1 mg/dL (ref 0.2–1.2)
Total Protein: 6.7 g/dL (ref 6.0–8.3)

## 2024-04-26 LAB — LDL CHOLESTEROL, DIRECT: Direct LDL: 130 mg/dL

## 2024-04-26 NOTE — Telephone Encounter (Signed)
 Copied from CRM #[id number]. Topic: Clinical - Lab/Test Results >> Apr 26, 2024  4:31 PM Brenda Carr wrote: Reason for CRM: Patient is calling in due to having an lab appointment today to have her thyroid  checked. Patient believes that her thyroid  levels were not check with the lab work today and other test were drawn instead. Another CRM has been placed for getting the tyroid orders placed but the patient is frustrated because she was told she did not need to fast so she ate and then they did a lipid panel, so her results are messed up. Patient is upset that her insurance would have to pay for this if it is incorrect lab work.

## 2024-04-26 NOTE — Telephone Encounter (Signed)
 Called patient to follow up with her Thyroid  questions. Patient states she was informed by her Endocrinologist Dr Obadiah, MD, she could have her Thyroid  testing done at her PCP's office. Patient was apologized to, as our office was never notified via E2C2 that she need to have her Thyroid  testing done here for Endocrinologist. Patient was concerned because she was NOT fasting for her Lipid Panel test that was performed today at her lab appointment. Spoke with lab technician Kaitlyn and was informed that they can add the requested test under Dr Marylynn, MD and forward it to her Endocrinologist. Patient was notified and verbalized understanding and has no further questions at this time.

## 2024-04-26 NOTE — Telephone Encounter (Signed)
 Copied from CRM #[id number]. Topic: Clinical - Request for Lab/Test Order >> Apr 26, 2024  4:18 PM Brenda Carr wrote: Reason for CRM: Patient called in because she wanted her thyroid  check but when getting her blood work result. Thyroid  was not check and she has an appointment with her endo on the 09/24.   If someone could please give her a call back. Thank you.  Call was dropped.

## 2024-04-29 ENCOUNTER — Ambulatory Visit

## 2024-04-29 DIAGNOSIS — E890 Postprocedural hypothyroidism: Secondary | ICD-10-CM

## 2024-04-29 LAB — T3, FREE: T3, Free: 5.5 pg/mL — ABNORMAL HIGH (ref 2.3–4.2)

## 2024-04-29 LAB — TSH: TSH: 1.55 m[IU]/L (ref 0.40–4.50)

## 2024-04-29 LAB — T4, FREE: Free T4: 1.1 ng/dL (ref 0.8–1.8)

## 2024-04-29 NOTE — Addendum Note (Signed)
 Addended by: EMERY PRESIDENT D on: 04/29/2024 10:49 AM   Modules accepted: Orders

## 2024-05-01 ENCOUNTER — Ambulatory Visit (INDEPENDENT_AMBULATORY_CARE_PROVIDER_SITE_OTHER): Admitting: "Endocrinology

## 2024-05-01 ENCOUNTER — Encounter: Payer: Self-pay | Admitting: "Endocrinology

## 2024-05-01 ENCOUNTER — Ambulatory Visit: Payer: Self-pay | Admitting: Internal Medicine

## 2024-05-01 VITALS — BP 120/80 | HR 73 | Ht 71.0 in | Wt 220.0 lb

## 2024-05-01 DIAGNOSIS — E890 Postprocedural hypothyroidism: Secondary | ICD-10-CM | POA: Diagnosis not present

## 2024-05-01 DIAGNOSIS — E079 Disorder of thyroid, unspecified: Secondary | ICD-10-CM | POA: Diagnosis not present

## 2024-05-01 DIAGNOSIS — H5789 Other specified disorders of eye and adnexa: Secondary | ICD-10-CM | POA: Diagnosis not present

## 2024-05-01 DIAGNOSIS — E010 Iodine-deficiency related diffuse (endemic) goiter: Secondary | ICD-10-CM | POA: Diagnosis not present

## 2024-05-01 NOTE — Progress Notes (Addendum)
 Outpatient Endocrinology Note Brenda Birmingham, MD  05/01/24   Brenda Carr 03-09-1956 [id number]  Referring Provider: Marylynn Verneita CROME, MD Primary Care Provider: Marylynn Verneita CROME, MD Subjective  No chief complaint on file.   Assessment & Plan  Diagnoses and all orders for this visit:  Postablative hypothyroidism -     TSH -     T4, free -     T3, free  Thyroid  eye disease  Thyromegaly -     US  THYROID ; Future    SAMYUKTHA BRAU is currently taking on armour thyroid  90 mg 6 days of the week, and 180 mg 1 days of the week.  Her insurance does not cover Armour, we discussed Cytomel as well. Patient was biochemically hyperthyroid on her last check.  Discussed the etiology for hyperthyroidism. Educated on thyroid  axis.  Recommend the following: Armour thyroid  90 mg 7 days of the week. Repeat labs in 3 months or sooner if symptoms of hyper or hypothyroidism develop.  Counseled on: -complications of untreated hyperthyroidism including atrial fibrillation, heart failure and osteoporosis -side effects of Methimazole including but not limited to allergic reaction, rash, bone marrow suppression, liver dysfunction and teratogenic potential -implications in pregnancy and breastfeeding -compliance and follow up needs    S/p Tepezza  in 2024 with Dr Odella, Toribio Del for thyroid  eye disease helped with proptosis and itching/tearing    Patient has an upcoming appointment with Andrena Nageotte at [facility] for strabismus, she is a pediatric ophthalmologist Patient requests an earlier appointment with her, sent an email to her to see if she can accommodate the patient earlier:patient has not heard from them Has appointment with another eye doctor    Ordered baseline thyroid  U/S  I have reviewed current medications, nurse's notes, allergies, vital signs, past medical and surgical history, family medical history, and social history for this encounter. Counseled patient on  symptoms, examination findings, lab findings, imaging results, treatment decisions and monitoring and prognosis. The patient understood the recommendations and agrees with the treatment plan. All questions regarding treatment plan were fully answered.   Return in about 3 months (around 07/31/2024) for tele-visit: 3:20 pm.   Brenda Birmingham, MD  05/01/24   I have reviewed current medications, nurse's notes, allergies, vital signs, past medical and surgical history, family medical history, and social history for this encounter. Counseled patient on symptoms, examination findings, lab findings, imaging results, treatment decisions and monitoring and prognosis. The patient understood the recommendations and agrees with the treatment plan. All questions regarding treatment plan were fully answered.   History of Present Illness Brenda Carr is a 68 y.o. year old female who presents to our clinic with post ablative hypothyroidism.    History of Graves disease in 2000s s/p RAI ablation. History of Grave's disease as well, with a flare up needing Tepezza  in 2024 with Dr Odella, Toribio Del. She has been on armour thyroid .  Has an appt. with Andrena Nageotte in 08/2023 for strabismus/double vision. She is a Runner, broadcasting/film/video by profession.   Currently on armour thyroid  90 mg 5 days of the week, and 180 mg 2 days of the week.    Symptoms suggestive of HYPOTHYROIDISM:  Excess fatigue No weight gain No cold intolerance  No constipation  No  Symptoms suggestive of HYPERTHYROIDISM:  weight loss  No heat intolerance No hyperdefecation  No palpitations  No  Compressive symptoms:  dysphagia  No dysphonia  No positional dyspnea (especially with simultaneous arms elevation)  No  Smokes  No On biotin  No Personal history of head/neck surgery/irradiation  Yes, RAI ablation   Physical Exam  BP 120/80   Pulse 73   Ht 5' 11 (1.803 m)   Wt 220 lb (99.8 kg)   SpO2 96%   BMI 30.68 kg/m   Constitutional: well developed, well nourished, L eye ptosis? Head: normocephalic, atraumatic, no exophthalmos Eyes: sclera anicteric, no redness Neck: + thyromegaly, no thyroid  tenderness; no nodules palpated Lungs: normal respiratory effort Neurology: alert and oriented, no fine hand tremor Skin: dry, no appreciable rashes Musculoskeletal: no appreciable defects Psychiatric: normal mood and affect  Allergies Allergies  Allergen Reactions   Lamisil  [Terbinafine  Hcl] Other (See Comments)    Flu like symptoms    Current Medications Patient's Medications  New Prescriptions   No medications on file  Previous Medications   AMLODIPINE  (NORVASC ) 5 MG TABLET    TAKE 1 TABLET DAILY   ARMOUR THYROID  90 MG TABLET    TAKE 1 TABLET DAILY   LOSARTAN  (COZAAR ) 100 MG TABLET    TAKE 1 TABLET AT BEDTIME  Modified Medications   No medications on file  Discontinued Medications   No medications on file    Past Medical History Past Medical History:  Diagnosis Date   GERD (gastroesophageal reflux disease)    Hypertension    Thyroid  disease    graves disease. Thyroid   ablated.    Past Surgical History Past Surgical History:  Procedure Laterality Date   CHOLECYSTECTOMY  2006   COLONOSCOPY N/A 03/07/2024   Procedure: COLONOSCOPY;  Surgeon: Jinny Carmine, MD;  Location: [facility] ENDOSCOPY;  Service: Endoscopy;  Laterality: N/A;   COLONOSCOPY WITH PROPOFOL  N/A 06/11/2018   Procedure: COLONOSCOPY WITH BIOPSY;  Surgeon: Jinny Carmine, MD;  Location: [facility];  Service: Endoscopy;  Laterality: N/A;   HEMOSTASIS CLIP PLACEMENT  03/07/2024   Procedure: CONTROL OF HEMORRHAGE, GI TRACT, ENDOSCOPIC, BY CLIPPING OR OVERSEWING;  Surgeon: Jinny Carmine, MD;  Location: [facility] ENDOSCOPY;  Service: Endoscopy;;   POLYPECTOMY N/A 06/11/2018   Procedure: POLYPECTOMY;  Surgeon: Jinny Carmine, MD;  Location: [facility] SUR[facility]ERY [facility];  Service: Endoscopy;  Laterality: N/A;   POLYPECTOMY  03/07/2024   Procedure:  POLYPECTOMY, INTESTINE;  Surgeon: Jinny Carmine, MD;  Location: [facility] ENDOSCOPY;  Service: Endoscopy;;    Family History family history includes Anuerysm (age of onset: 28) in her mother; Cancer in her paternal grandmother; Cancer (age of onset: 32) in her maternal grandmother; Cancer - Colon (age of onset: 72) in her paternal aunt; Congestive Heart Failure (age of onset: 108) in her father; Early death in her maternal grandmother and mother; Hearing loss in her brother and father; Heart disease in her father and maternal grandfather; Hypertension in her brother and brother; Mental illness in her paternal grandfather.  Social History Social History   Socioeconomic History   Marital status: Married    Spouse name: Not on file   Number of children: Not on file   Years of education: Not on file   Highest education level: Not on file  Occupational History   Not on file  Tobacco Use   Smoking status: Never   Smokeless tobacco: Never  Vaping Use   Vaping status: Never Used  Substance and Sexual Activity   Alcohol use: Yes    Comment: 3-4x/yr   Drug use: Never   Sexual activity: Not Currently  Other Topics Concern   Not on file  Social History Narrative   Not on file   Social  Drivers of Health   Financial Resource Strain: Low Risk  (11/22/2023)   Overall Financial Resource Strain (CARDIA)    Difficulty of Paying Living Expenses: Not hard at all  Food Insecurity: No Food Insecurity (11/22/2023)   Hunger Vital Sign    Worried About Running Out of Food in the Last Year: Never true    Ran Out of Food in the Last Year: Never true  Transportation Needs: No Transportation Needs (11/22/2023)   PRAPARE - Administrator, Civil Service (Medical): No    Lack of Transportation (Non-Medical): No  Physical Activity: Inactive (11/22/2023)   Exercise Vital Sign    Days of Exercise per Week: 0 days    Minutes of Exercise per Session: 0 min  Stress: No Stress Concern Present (11/22/2023)    [employer] of Occupational Health - Occupational Stress Questionnaire    Feeling of Stress : Only a little  Social Connections: Moderately Integrated (11/22/2023)   Social Connection and Isolation Panel    Frequency of Communication with Friends and Family: More than three times a week    Frequency of Social Gatherings with Friends and Family: More than three times a week    Attends Religious Services: More than 4 times per year    Active Member of [employer] or Organizations: Yes    Attends Banker Meetings: More than 4 times per year    Marital Status: Widowed  Intimate Partner Violence: Not At Risk (11/22/2023)   Humiliation, Afraid, Rape, and Kick questionnaire    Fear of Current or Ex-Partner: No    Emotionally Abused: No    Physically Abused: No    Sexually Abused: No    Laboratory Investigations Lab Results  Component Value Date   TSH 1.55 04/29/2024   TSH 0.38 (L) 03/12/2024   TSH 0.30 (L) 10/18/2023   FREET4 1.1 04/29/2024   FREET4 1.2 03/12/2024   FREET4 0.82 10/18/2023     No results found for: TSI   No components found for: TRAB   Lab Results  Component Value Date   CHOL 189 04/26/2024   Lab Results  Component Value Date   HDL 38.60 (L) 04/26/2024   Lab Results  Component Value Date   LDLCALC 123 (H) 04/26/2024   Lab Results  Component Value Date   TRIG 138.0 04/26/2024   Lab Results  Component Value Date   CHOLHDL 5 04/26/2024   Lab Results  Component Value Date   CREATININE 0.82 04/26/2024   Lab Results  Component Value Date   GFR 73.10 04/26/2024      Component Value Date/Time   NA 138 04/26/2024 0901   K 4.7 04/26/2024 0901   CL 103 04/26/2024 0901   CO2 28 04/26/2024 0901   GLUCOSE 97 04/26/2024 0901   BUN 26 (H) 04/26/2024 0901   CREATININE 0.82 04/26/2024 0901   CALCIUM 9.7 04/26/2024 0901   PROT 6.7 04/26/2024 0901   PROT 7.1 11/21/2017 0948   ALBUMIN 4.1 04/26/2024 0901   ALBUMIN 4.7 11/21/2017 0948   AST 13  04/26/2024 0901   ALT 16 04/26/2024 0901   ALKPHOS 50 04/26/2024 0901   BILITOT 1.0 04/26/2024 0901   BILITOT 0.9 11/21/2017 0948      Latest Ref Rng & Units 04/26/2024    9:01 AM 10/18/2023    1:16 PM 07/05/2023    8:38 AM  BMP  Glucose 70 - 99 mg/dL 97  99  92   BUN 6 - 23  mg/dL 26  10  17    Creatinine 0.40 - 1.20 mg/dL 9.17  9.25  9.20   Sodium 135 - 145 mEq/L 138  138  140   Potassium 3.5 - 5.1 mEq/L 4.7  4.5  4.0   Chloride 96 - 112 mEq/L 103  104  105   CO2 19 - 32 mEq/L 28  29  27    Calcium 8.4 - 10.5 mg/dL 9.7  9.8  9.5        Component Value Date/Time   WBC 5.7 07/05/2023 0838   RBC 4.67 07/05/2023 0838   HGB 13.9 07/05/2023 0838   HCT 42.0 07/05/2023 0838   PLT 210.0 07/05/2023 0838   MCV 90.0 07/05/2023 0838   MCHC 33.1 07/05/2023 0838   RDW 13.2 07/05/2023 0838   LYMPHSABS 2.1 07/05/2023 0838   MONOABS 0.5 07/05/2023 0838   EOSABS 0.2 07/05/2023 0838   BASOSABS 0.1 07/05/2023 0838      Parts of this note may have been dictated using voice recognition software. There may be variances in spelling and vocabulary which are unintentional. Not all errors are proofread. Please notify the dino if any discrepancies are noted or if the meaning of any statement is not clear.

## 2024-05-02 ENCOUNTER — Ambulatory Visit: Admitting: "Endocrinology

## 2024-05-03 ENCOUNTER — Telehealth: Payer: Self-pay | Admitting: Internal Medicine

## 2024-05-03 DIAGNOSIS — E890 Postprocedural hypothyroidism: Secondary | ICD-10-CM

## 2024-05-03 NOTE — Telephone Encounter (Signed)
 Pt is wanting to have all her blood work done including getting her thyroid  checked. Pt would like them done before her appointment for her physical. I will go ahead and scheduled the pt for labs.

## 2024-05-08 ENCOUNTER — Ambulatory Visit
Admission: RE | Admit: 2024-05-08 | Discharge: 2024-05-08 | Disposition: A | Discharge status: Home / Self Care | Source: Ambulatory Visit | Attending: "Endocrinology | Admitting: "Endocrinology

## 2024-05-08 ENCOUNTER — Ambulatory Visit: Admitting: Audiologist

## 2024-05-08 ENCOUNTER — Ambulatory Visit: Payer: Self-pay | Admitting: "Endocrinology

## 2024-05-08 DIAGNOSIS — E010 Iodine-deficiency related diffuse (endemic) goiter: Secondary | ICD-10-CM | POA: Insufficient documentation

## 2024-05-31 ENCOUNTER — Encounter: Payer: Self-pay | Admitting: "Endocrinology

## 2024-06-19 ENCOUNTER — Other Ambulatory Visit: Payer: Self-pay | Admitting: Internal Medicine

## 2024-06-26 ENCOUNTER — Ambulatory Visit: Admitting: Audiologist

## 2024-06-26 NOTE — Telephone Encounter (Signed)
 open in error

## 2024-07-06 ENCOUNTER — Other Ambulatory Visit: Payer: Self-pay | Admitting: Internal Medicine

## 2024-07-12 ENCOUNTER — Other Ambulatory Visit

## 2024-07-15 ENCOUNTER — Telehealth: Payer: Self-pay | Admitting: Internal Medicine

## 2024-07-15 DIAGNOSIS — E890 Postprocedural hypothyroidism: Secondary | ICD-10-CM

## 2024-07-15 NOTE — Telephone Encounter (Signed)
 Patient was notified via MyChart.

## 2024-07-15 NOTE — Telephone Encounter (Signed)
 Patient need lab orders.

## 2024-07-16 ENCOUNTER — Other Ambulatory Visit

## 2024-07-17 ENCOUNTER — Ambulatory Visit: Admitting: "Endocrinology

## 2024-07-17 ENCOUNTER — Other Ambulatory Visit (INDEPENDENT_AMBULATORY_CARE_PROVIDER_SITE_OTHER)

## 2024-07-17 DIAGNOSIS — E890 Postprocedural hypothyroidism: Secondary | ICD-10-CM

## 2024-07-17 LAB — T4, FREE: Free T4: 0.77 ng/dL (ref 0.60–1.60)

## 2024-07-17 LAB — T3, FREE: T3, Free: 4.7 pg/mL — ABNORMAL HIGH (ref 2.3–4.2)

## 2024-07-17 LAB — TSH: TSH: 1.47 u[IU]/mL (ref 0.35–5.50)

## 2024-07-18 ENCOUNTER — Telehealth: Payer: Self-pay

## 2024-07-18 NOTE — Telephone Encounter (Signed)
 Spoke with pt and she stated that she if repeat labs are needed she will just have them done tomorrow while she is here for her appt. Tonya in the lab is following up on the labs that were drawn yesterday because they have resulted.

## 2024-07-18 NOTE — Telephone Encounter (Signed)
 Copied from CRM #[id number]. Topic: Appointments - Scheduling Inquiry for Clinic >> Jul 18, 2024 10:40 AM Charolett L wrote: Reason for CRM: Patient stated that she was adv to come in and do labs over and wanted to adv the office she will be there around 3:30pm

## 2024-07-19 ENCOUNTER — Other Ambulatory Visit

## 2024-07-19 ENCOUNTER — Ambulatory Visit: Admitting: Internal Medicine

## 2024-07-19 ENCOUNTER — Encounter: Payer: Self-pay | Admitting: Internal Medicine

## 2024-07-19 VITALS — BP 160/104 | HR 86 | Ht 71.0 in | Wt 222.0 lb

## 2024-07-19 DIAGNOSIS — Z0000 Encounter for general adult medical examination without abnormal findings: Secondary | ICD-10-CM

## 2024-07-19 DIAGNOSIS — E782 Mixed hyperlipidemia: Secondary | ICD-10-CM

## 2024-07-19 DIAGNOSIS — E669 Obesity, unspecified: Secondary | ICD-10-CM | POA: Diagnosis not present

## 2024-07-19 DIAGNOSIS — I10 Essential (primary) hypertension: Secondary | ICD-10-CM | POA: Diagnosis not present

## 2024-07-19 DIAGNOSIS — E890 Postprocedural hypothyroidism: Secondary | ICD-10-CM

## 2024-07-19 MED ORDER — AMLODIPINE BESYLATE 5 MG PO TABS: 5.0000 mg | ORAL_TABLET | Freq: Every day | ORAL | 1 refills | Status: AC

## 2024-07-19 NOTE — Patient Instructions (Addendum)
°  I don't have any experience or confidence in using ivermectin as an anti inflammatory,  I do  recommend trying turmeric and glucosamine /chondrointin sulfate for your supplements that may help your joint pain .  Atypical dose is 500 mg dose each  and they can ben taken 2 times daily and have no serious side effects    Hoping you have a wonderful and blessed Christmas season,    Dr. Shiela Bruns

## 2024-07-19 NOTE — Progress Notes (Signed)
 Patient ID: Brenda Carr, female    DOB: 01-17-1956  Age: 68 y.o. MRN: [id number]  The patient is here for annual preventive examination and management of other chronic and acute problems.   The risk factors are reflected in the social history.  The roster of all physicians providing medical care to patient - is listed in the Snapshot section of the chart.  Activities of daily living:  The patient is 100% independent in all ADLs: dressing, toileting, feeding as well as independent mobility  Home safety : The patient has smoke detectors in the home. They wear seatbelts.  There are no firearms at home. There is no violence in the home.   There is no risks for hepatitis, STDs or HIV. There is no   history of blood transfusion. They have no travel history to infectious disease endemic areas of the world.  The patient has seen their dentist in the last six month. They have seen their eye doctor in the last year. They admit to slight hearing difficulty with regard to whispered voices and some television programs.  They have deferred audiologic testing in the last year.  They do not  have excessive sun exposure. Discussed the need for sun protection: hats, long sleeves and use of sunscreen if there is significant sun exposure.   Diet: the importance of a healthy diet is discussed. They do have a healthy diet.  The benefits of regular aerobic exercise were discussed. She has had to stop walking since she developed diplopia .   Depression screen: there are no signs or vegative symptoms of depression- irritability, change in appetite, anhedonia, sadness/tearfullness.  Cognitive assessment: the patient manages all their financial and personal affairs and is actively engaged. They could relate day,date,year and events; recalled 2/3 objects at 3 minutes; performed clock-face test normally.  The following portions of the patient's history were reviewed and updated as appropriate: allergies, current  medications, past family history, past medical history,  past surgical history, past social history  and problem list.  Visual acuity was not assessed per patient preference since she has regular follow up with her ophthalmologist. Hearing and body mass index were assessed and reviewed.   During the course of the visit the patient was educated and counseled about appropriate screening and preventive services including : fall prevention , diabetes screening, nutrition counseling, colorectal cancer screening, and recommended immunizations.    CC: The primary encounter diagnosis was Encounter for preventive health examination. Diagnoses of Postablative hypothyroidism, Mixed hyperlipidemia, Obesity (BMI 30-39.9), and White coat syndrome with hypertension were also pertinent to this visit.   1) Graves Disease:  she completed treatment with Tepezza  for thyroid  eye flare. 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    Saw [facility]  recently for evaluation of left hip  pain .  Told there was no change by repeat films;; now getting PT and helping .  Taking meloxicam  but stopped using it because she  read that one of her BP medications  will interact  with it.   Right buttock gluteal tendonopathy also being addressed by PT  HTN:  Hypertension: patient checks blood pressure twice weekly at home.  Readings have been for the most part <130/80 at rest . Patient is  following a reduced salt diet most days and is taking amlodipine  5 mg and losartan  100 mg daily     History Shenell has a past medical history of GERD (gastroesophageal reflux disease), Hypertension, and Thyroid  disease.   She has a past surgical history that includes Cholecystectomy (2006); Colonoscopy with propofol  (N/A,  06/11/2018); polypectomy (N/A, 06/11/2018); Colonoscopy (N/A, 03/07/2024); Polypectomy (03/07/2024); Hemostasis clip placement (03/07/2024); and Cosmetic surgery.   Her family history includes Anuerysm (age of onset: 8) in her mother; Arthritis in her father; Cancer in her paternal grandmother; Cancer (age of onset: 41) in her maternal grandmother; Cancer - Colon (age of onset: 43) in her paternal aunt; Congestive Heart Failure (age of onset: 10) in her father; Early death in her maternal grandmother and mother; Hearing loss in her brother and father; Heart disease in her father and maternal grandfather; Hypertension in her brother and brother; Mental illness in her paternal grandfather.She reports that she has never smoked. She has never used smokeless tobacco. She reports current alcohol use. She reports that she does not use drugs.  Outpatient Medications Prior to Visit  Medication Sig Dispense Refill   ARMOUR THYROID  90 MG tablet TAKE 1 TABLET DAILY (Patient taking differently: Take 90 mg by mouth daily. Takes 1 tablet all days but Sunday, takes 2 tablets on Sunday) 90 tablet 1   losartan  (COZAAR ) 100 MG tablet TAKE 1 TABLET AT BEDTIME 90 tablet 1   amLODipine  (NORVASC ) 5 MG tablet TAKE 1 TABLET DAILY 90 tablet 1   No facility-administered medications prior to visit.    Review of Systems  Patient denies headache, fevers, malaise, unintentional weight loss, skin rash, eye pain, sinus congestion and sinus pain, sore throat, dysphagia,  hemoptysis , cough, dyspnea, wheezing, chest pain, palpitations, orthopnea, edema, abdominal pain, nausea, melena, diarrhea, constipation, flank pain, dysuria, hematuria, urinary  Frequency, nocturia, numbness, tingling, seizures,  Focal weakness, Loss of consciousness,  Tremor, insomnia, depression, anxiety, and suicidal ideation.     Objective:  BP (!) 160/104   Pulse 86   Ht 5' 11 (1.803 m)   Wt 222 lb (100.7 kg)   SpO2 97%   BMI 30.96 kg/m   Physical  Exam Vitals reviewed.  Constitutional:      General: She is not in acute distress.    Appearance: Normal appearance. She is well-developed and normal weight. She is not ill-appearing, toxic-appearing or diaphoretic.  HENT:     Head: Normocephalic.     Right Ear: Tympanic membrane, ear canal and external ear normal. There is no impacted cerumen.     Left Ear: Tympanic membrane, ear canal and external ear normal. There is no impacted cerumen.     Nose: Nose normal.     Mouth/Throat:     Mouth: Mucous membranes are moist.     Pharynx: Oropharynx is clear.  Eyes:     General: No scleral icterus.       Right eye: No discharge.        Left eye: No discharge.     Conjunctiva/sclera: Conjunctivae normal.     Pupils: Pupils are equal, round, and reactive to light.  Neck:     Thyroid : No thyromegaly.     Vascular: No carotid bruit or JVD.  Cardiovascular:     Rate and Rhythm: Normal rate and regular rhythm.     Heart sounds: Normal heart sounds.  Pulmonary:     Effort: Pulmonary effort is normal. No respiratory distress.     Breath sounds:  Normal breath sounds.  Chest:  Breasts:    Breasts are symmetrical.     Right: Normal. No swelling, inverted nipple, mass, nipple discharge, skin change or tenderness.     Left: Normal. No swelling, inverted nipple, mass, nipple discharge, skin change or tenderness.  Abdominal:     General: Bowel sounds are normal.     Palpations: Abdomen is soft. There is no mass.     Tenderness: There is no abdominal tenderness. There is no guarding or rebound.  Musculoskeletal:        General: Normal range of motion.     Cervical back: Normal range of motion and neck supple.  Lymphadenopathy:     Cervical: No cervical adenopathy.     Upper Body:     Right upper body: No supraclavicular, axillary or pectoral adenopathy.     Left upper body: No supraclavicular, axillary or pectoral adenopathy.  Skin:    General: Skin is warm and dry.  Neurological:      General: No focal deficit present.     Mental Status: She is alert and oriented to person, place, and time. Mental status is at baseline.  Psychiatric:        Mood and Affect: Mood normal.        Behavior: Behavior normal.        Thought Content: Thought content normal.        Judgment: Judgment normal.     Assessment & Plan:  Encounter for preventive health examination Assessment & Plan: age appropriate education and counseling updated, referrals for preventative services and immunizations addressed, dietary and smoking counseling addressed, most recent labs reviewed.  I have personally reviewed and have noted:   1) the patient's medical and social history 2) The pt's use of alcohol, tobacco, and illicit drugs 3) The patient's current medications and supplements 4) Functional ability including ADL's, fall risk, home safety risk, hearing and visual impairment 5) Diet and physical activities 6) Evidence for depression or mood disorder 7) The patient's height, weight, and BMI have been recorded in the chart    I have made referrals, and provided counseling and education based on review of the above    Postablative hypothyroidism Assessment & Plan: Managed with Armour thyroid  due to history of intolerance to Synthroid.  Thyroid  function  is now managed by [facility] endocrinology  whohas lowered her dose recently  due to mildly elevated  T3 in the setting of  normal TSH And Free T4. Repeat confirmatory  levels are pending   Lab Results  Component Value Date   TSH 1.47 07/17/2024     Orders: -     TSH -     T3, free -     T4, free  Mixed hyperlipidemia Assessment & Plan: 10 yr risk using the AHA cardiac risk calculator is 10 to 14% depending on blood pressure reading.. continue annual surveillance  Lab Results  Component Value Date   CHOL 189 04/26/2024   HDL 38.60 (L) 04/26/2024   LDLCALC 123 (H) 04/26/2024   LDLDIRECT 130.0 04/26/2024   TRIG 138.0 04/26/2024   CHOLHDL 5  04/26/2024      Obesity (BMI 30-39.9) Assessment & Plan: She reached a nadir with a 25 lb wight loss in July  but has gained back several lbs due to complications from treatment for Thyroid  eye disease that have resttricted her mobility and balance    White coat syndrome with hypertension Assessment & Plan: Home readings are congruent using  2  machines and are similar to readings obtained here simultaneously.  All home readings have been  below 130/80 .  No changes today. Continue amlodipine  5 mg and losartan  100 mg at current doses Repeat assessment of GFR has been done;  GFR   is  normal   Lab Results  Component Value Date   CREATININE 0.82 04/26/2024   . Lab Results  Component Value Date   NA 138 04/26/2024   K 4.7 04/26/2024   CL 103 04/26/2024   CO2 28 04/26/2024      Other orders -     amLODIPine  Besylate; Take 1 tablet (5 mg total) by mouth daily.  Dispense: 90 tablet; Refill: 1      I provided 40 minutes of  face-to-face time during this encounter reviewing patient's current problems and past surgeries,  recent labs and imaging studies, providing counseling on the above mentioned problems , and coordination  of care .   Follow-up: No follow-ups on file.   Verneita LITTIE Kettering, MD

## 2024-07-21 NOTE — Assessment & Plan Note (Signed)
 She reached a nadir with a 25 lb wight loss in July  but has gained back several lbs due to complications from treatment for Thyroid  eye disease that have resttricted her mobility and balance

## 2024-07-21 NOTE — Assessment & Plan Note (Signed)

## 2024-07-21 NOTE — Assessment & Plan Note (Addendum)
 Managed with Armour thyroid  due to history of intolerance to Synthroid.  Thyroid  function  is now managed by [facility] endocrinology  whohas lowered her dose recently  due to mildly elevated  T3 in the setting of  normal TSH And Free T4. Repeat confirmatory  levels are pending   Lab Results  Component Value Date   TSH 1.47 07/17/2024

## 2024-07-21 NOTE — Assessment & Plan Note (Addendum)
 10 yr risk using the AHA cardiac risk calculator is 10 to 14% depending on blood pressure reading.. continue annual surveillance  Lab Results  Component Value Date   CHOL 189 04/26/2024   HDL 38.60 (L) 04/26/2024   LDLCALC 123 (H) 04/26/2024   LDLDIRECT 130.0 04/26/2024   TRIG 138.0 04/26/2024   CHOLHDL 5 04/26/2024

## 2024-07-21 NOTE — Assessment & Plan Note (Signed)
 Home readings are congruent using  2  machines and are similar to readings obtained here simultaneously.  All home readings have been  below 130/80 .  No changes today. Continue amlodipine  5 mg and losartan  100 mg at current doses Repeat assessment of GFR has been done;  GFR   is  normal   Lab Results  Component Value Date   CREATININE 0.82 04/26/2024   . Lab Results  Component Value Date   NA 138 04/26/2024   K 4.7 04/26/2024   CL 103 04/26/2024   CO2 28 04/26/2024

## 2024-07-24 ENCOUNTER — Telehealth (INDEPENDENT_AMBULATORY_CARE_PROVIDER_SITE_OTHER): Admitting: "Endocrinology

## 2024-07-24 ENCOUNTER — Encounter: Payer: Self-pay | Admitting: "Endocrinology

## 2024-07-24 VITALS — BP 111/74 | Ht 71.0 in | Wt 220.0 lb

## 2024-07-24 DIAGNOSIS — E890 Postprocedural hypothyroidism: Secondary | ICD-10-CM | POA: Diagnosis not present

## 2024-07-24 NOTE — Progress Notes (Signed)
 The patient reports they are currently: [place]. I spent 11-12 minutes on the video with the patient on the date of service. I spent an additional 5 minutes on pre- and post-visit activities on the date of service.   The patient was physically located in [place]  or a state in which I am permitted to provide care. The patient and/or parent/guardian understood that s/he may incur co-pays and cost sharing, and agreed to the telemedicine visit. The visit was reasonable and appropriate under the circumstances given the patient's presentation at the time.  The patient and/or parent/guardian has been advised of the potential risks and limitations of this mode of treatment (including, but not limited to, the absence of in-person examination) and has agreed to be treated using telemedicine. The patient's/patient's family's questions regarding telemedicine have been answered.   The patient and/or parent/guardian has also been advised to contact their provider's office for worsening conditions, and seek emergency medical treatment and/or call 911 if the patient deems either necessary.     Outpatient Endocrinology Note [name], MD  07/24/2024   Brenda Carr 1955-11-25 [id number]  Referring Provider: Marylynn Verneita CROME, MD Primary Care Provider: Marylynn Verneita CROME, MD Subjective  No chief complaint on file.   Assessment & Plan  Diagnoses and all orders for this visit:  Postablative hypothyroidism     Brenda Carr is currently taking on armour thyroid  90 mg 5 days of the week, and 180 mg 2 days of the week.  Her insurance does not cover Armour, we discussed Cytomel as well. Patient was biochemically hyperthyroid on her last check; has always had elevated free T3.  Patient was recommended to take Armour Thyroid  1 pill every day but after 1.92-months she self resumed her original dose of 8 pills/week because of feeling very constipated, tired and depressed.  Explained to the patient  again that given her elevated free T3, I do not recommend this 8 pills/week dose and cannot continue to prescribe it; as it may increase her chances of heart arrhythmias.  Recommend another opinion as patient would like to continue 8 pills/week dose only.  Discussed the etiology for hyperthyroidism. Educated on thyroid  axis.  Recommend the following: Armour thyroid  90 mg 7 days of the week. Repeat labs in 3 months or sooner if symptoms of hyper or hypothyroidism develop.  Counseled on: -complications of untreated hyperthyroidism including atrial fibrillation, heart failure and osteoporosis -side effects of Methimazole including but not limited to allergic reaction, rash, bone marrow suppression, liver dysfunction and teratogenic potential -implications in pregnancy and breastfeeding -compliance and follow up needs    S/p Tepezza  in 2024 with Dr Odella, Toribio Del for thyroid  eye disease helped with proptosis and itching/tearing    Patient has an upcoming appointment with Andrena Nageotte at [facility] for strabismus, she is a pediatric ophthalmologist Patient requests an earlier appointment with her, sent an email to her to see if she can accommodate the patient earlier:patient has not heard from them Had appointment with another eye doctor    05/08/24: Baseline thyroid  U/S reviewed: reported no thyromegaly, small and heterogeneous thyroid  gland. 0.7 cm left inferior nodule does not meet criteria for biopsy or dedicated follow-up. No further follow-up indicated unless clinically warranted  I have reviewed current medications, nurse's notes, allergies, vital signs, past medical and surgical history, family medical history, and social history for this encounter. Counseled patient on symptoms, examination findings, lab findings, imaging results, treatment decisions and monitoring and prognosis. The patient understood the  recommendations and agrees with the treatment plan. All questions regarding  treatment plan were fully answered.   Return for Recommend second opinion and transfer of care to another endocrinologist in our office.   [name], MD  07/24/2024   I have reviewed current medications, nurse's notes, allergies, vital signs, past medical and surgical history, family medical history, and social history for this encounter. Counseled patient on symptoms, examination findings, lab findings, imaging results, treatment decisions and monitoring and prognosis. The patient understood the recommendations and agrees with the treatment plan. All questions regarding treatment plan were fully answered.   History of Present Illness Brenda Carr is a 68 y.o. year old female who presents to our clinic with post ablative hypothyroidism.    History of Graves disease in 2000s s/p RAI ablation. History of Grave's disease as well, with a flare up needing Tepezza  in 2024 with Dr Odella, Toribio Del. She has been on armour thyroid .  Has an appt. with Andrena Nageotte in 08/2023 for strabismus/double vision. She is a runner, broadcasting/film/video by profession.   Currently on armour thyroid  90 mg 5 days of the week, and 180 mg 2 days of the week.    Symptoms suggestive of HYPOTHYROIDISM:  Excess fatigue No weight gain No cold intolerance  No constipation  No  Symptoms suggestive of HYPERTHYROIDISM:  weight loss  No heat intolerance No hyperdefecation  No palpitations  No  Compressive symptoms:  dysphagia  No dysphonia  No positional dyspnea (especially with simultaneous arms elevation)  No  Smokes  No On biotin  No Personal history of head/neck surgery/irradiation  Yes, RAI ablation   Physical Exam  BP 111/74   Ht 5' 11 (1.803 m)   Wt 220 lb (99.8 kg)   BMI 30.68 kg/m  Constitutional: well developed, well nourished, L eye ptosis? Head: normocephalic, atraumatic, no exophthalmos Eyes: sclera anicteric, no redness Neck: - thyromegaly, no thyroid  tenderness; no nodules  palpated Lungs: normal respiratory effort Neurology: alert and oriented, no fine hand tremor Skin: dry, no appreciable rashes Musculoskeletal: no appreciable defects Psychiatric: normal mood and affect  Allergies Allergies  Allergen Reactions   Lamisil  [Terbinafine  Hcl] Other (See Comments)    Flu like symptoms    Current Medications Patient's Medications  New Prescriptions   No medications on file  Previous Medications   AMLODIPINE  (NORVASC ) 5 MG TABLET    Take 1 tablet (5 mg total) by mouth daily.   ARMOUR THYROID  90 MG TABLET    TAKE 1 TABLET DAILY   LOSARTAN  (COZAAR ) 100 MG TABLET    TAKE 1 TABLET AT BEDTIME  Modified Medications   No medications on file  Discontinued Medications   No medications on file    Past Medical History Past Medical History:  Diagnosis Date   GERD (gastroesophageal reflux disease)    Hypertension    Thyroid  disease    graves disease. Thyroid   ablated.    Past Surgical History Past Surgical History:  Procedure Laterality Date   CHOLECYSTECTOMY  2006   COLONOSCOPY N/A 03/07/2024   Procedure: COLONOSCOPY;  Surgeon: Jinny Carmine, MD;  Location: [facility] END[facility]COPY;  Service: Endoscopy;  Laterality: N/A;   COLONOSCOPY WITH PROPOFOL  N/A 06/11/2018   Procedure: COLONOSCOPY WITH BIOPSY;  Surgeon: Jinny Carmine, MD;  Location: [facility] SURGERY [facility]TR;  Service: Endoscopy;  Laterality: N/A;   COSMETIC SURGERY     HEMOSTASIS CLIP PLACEMENT  03/07/2024   Procedure: CONTROL OF HEMORRHAGE, GI TRACT, ENDOSCOPIC, BY CLIPPING OR OVERSEWING;  Surgeon: Jinny Carmine,  MD;  Location: [facility] ENDOSCOPY;  Service: Endoscopy;;   POLYPECTOMY N/A 06/11/2018   Procedure: POLYPECTOMY;  Surgeon: Jinny Carmine, MD;  Location: [facility] SURGERY [facility];  Service: Endoscopy;  Laterality: N/A;   POLYPECTOMY  03/07/2024   Procedure: POLYPECTOMY, INTESTINE;  Surgeon: Jinny Carmine, MD;  Location: [facility] ENDOSCOPY;  Service: Endoscopy;;    Family History family history includes Anuerysm (age  of onset: 78) in her mother; Arthritis in her father; Cancer in her paternal grandmother; Cancer (age of onset: 30) in her maternal grandmother; Cancer - Colon (age of onset: 62) in her paternal aunt; Congestive Heart Failure (age of onset: 13) in her father; Early death in her maternal grandmother and mother; Hearing loss in her brother and father; Heart disease in her father and maternal grandfather; Hypertension in her brother and brother; Mental illness in her paternal grandfather.  Social History Social History   Socioeconomic History   Marital status: Married    Spouse name: Not on file   Number of children: Not on file   Years of education: Not on file   Highest education level: Not on file  Occupational History   Not on file  Tobacco Use   Smoking status: Never   Smokeless tobacco: Never  Vaping Use   Vaping status: Never Used  Substance and Sexual Activity   Alcohol use: Yes    Comment: 3-4x/yr   Drug use: Never   Sexual activity: Not Currently  Other Topics Concern   Not on file  Social History Narrative   Not on file   Social Drivers of Health   Tobacco Use: Low Risk (07/24/2024)   Patient History    Smoking Tobacco Use: Never    Smokeless Tobacco Use: Never    Passive Exposure: Not on file  Financial Resource Strain: Low Risk (11/22/2023)   Overall Financial Resource Strain (CARDIA)    Difficulty of Paying Living Expenses: Not hard at all  Food Insecurity: No Food Insecurity (11/22/2023)   Hunger Vital Sign    Worried About Running Out of Food in the Last Year: Never true    Ran Out of Food in the Last Year: Never true  Transportation Needs: No Transportation Needs (11/22/2023)   PRAPARE - Administrator, Civil Service (Medical): No    Lack of Transportation (Non-Medical): No  Physical Activity: Inactive (11/22/2023)   Exercise Vital Sign    Days of Exercise per Week: 0 days    Minutes of Exercise per Session: 0 min  Stress: No Stress Concern  Present (11/22/2023)   [employer] of Occupational Health - Occupational Stress Questionnaire    Feeling of Stress : Only a little  Social Connections: Moderately Integrated (11/22/2023)   Social Connection and Isolation Panel    Frequency of Communication with Friends and Family: More than three times a week    Frequency of Social Gatherings with Friends and Family: More than three times a week    Attends Religious Services: More than 4 times per year    Active Member of [employer] or Organizations: Yes    Attends Banker Meetings: More than 4 times per year    Marital Status: Widowed  Intimate Partner Violence: Not At Risk (11/22/2023)   Humiliation, Afraid, Rape, and Kick questionnaire    Fear of Current or Ex-Partner: No    Emotionally Abused: No    Physically Abused: No    Sexually Abused: No  Depression (PHQ2-9): Low Risk (07/19/2024)   Depression (PHQ2-9)  PHQ-2 Score: 1  Alcohol Screen: Low Risk (11/22/2023)   Alcohol Screen    Last Alcohol Screening Score (AUDIT): 0  Housing: Unknown (11/22/2023)   Housing Stability Vital Sign    Unable to Pay for Housing in the Last Year: No    Number of Times Moved in the Last Year: Not on file    Homeless in the Last Year: No  Utilities: Not At Risk (11/22/2023)   AHC Utilities    Threatened with loss of utilities: No  Health Literacy: Not on file    Laboratory Investigations Lab Results  Component Value Date   TSH 1.47 07/17/2024   TSH 1.55 04/29/2024   TSH 0.38 (L) 03/12/2024   FREET4 0.77 07/17/2024   FREET4 1.1 04/29/2024   FREET4 1.2 03/12/2024     No results found for: TSI   No components found for: TRAB   Lab Results  Component Value Date   CHOL 189 04/26/2024   Lab Results  Component Value Date   HDL 38.60 (L) 04/26/2024   Lab Results  Component Value Date   LDLCALC 123 (H) 04/26/2024   Lab Results  Component Value Date   TRIG 138.0 04/26/2024   Lab Results  Component Value Date    CHOLHDL 5 04/26/2024   Lab Results  Component Value Date   CREATININE 0.82 04/26/2024   Lab Results  Component Value Date   GFR 73.10 04/26/2024      Component Value Date/Time   NA 138 04/26/2024 0901   K 4.7 04/26/2024 0901   CL 103 04/26/2024 0901   CO2 28 04/26/2024 0901   GLUCOSE 97 04/26/2024 0901   BUN 26 (H) 04/26/2024 0901   CREATININE 0.82 04/26/2024 0901   CALCIUM 9.7 04/26/2024 0901   PROT 6.7 04/26/2024 0901   PROT 7.1 11/21/2017 0948   ALBUMIN 4.1 04/26/2024 0901   ALBUMIN 4.7 11/21/2017 0948   AST 13 04/26/2024 0901   ALT 16 04/26/2024 0901   ALKPHOS 50 04/26/2024 0901   BILITOT 1.0 04/26/2024 0901   BILITOT 0.9 11/21/2017 0948      Latest Ref Rng & Units 04/26/2024    9:01 AM 10/18/2023    1:16 PM 07/05/2023    8:38 AM  BMP  Glucose 70 - 99 mg/dL 97  99  92   BUN 6 - 23 mg/dL 26  10  17    Creatinine 0.40 - 1.20 mg/dL 9.17  9.25  9.20   Sodium 135 - 145 mEq/L 138  138  140   Potassium 3.5 - 5.1 mEq/L 4.7  4.5  4.0   Chloride 96 - 112 mEq/L 103  104  105   CO2 19 - 32 mEq/L 28  29  27    Calcium 8.4 - 10.5 mg/dL 9.7  9.8  9.5        Component Value Date/Time   WBC 5.7 07/05/2023 0838   RBC 4.67 07/05/2023 0838   HGB 13.9 07/05/2023 0838   HCT 42.0 07/05/2023 0838   PLT 210.0 07/05/2023 0838   MCV 90.0 07/05/2023 0838   MCHC 33.1 07/05/2023 0838   RDW 13.2 07/05/2023 0838   LYMPHSABS 2.1 07/05/2023 0838   MONOABS 0.5 07/05/2023 0838   EOSABS 0.2 07/05/2023 0838   BASOSABS 0.1 07/05/2023 0838      Parts of this note may have been dictated using voice recognition software. There may be variances in spelling and vocabulary which are unintentional. Not all errors are proofread. Please notify the chartered loss adjuster  if any discrepancies are noted or if the meaning of any statement is not clear.

## 2024-07-25 ENCOUNTER — Telehealth: Payer: Self-pay

## 2024-07-25 NOTE — Telephone Encounter (Signed)
-----   Message from [name], MD sent at 07/24/2024  3:55 PM EST ----- Please let the patient know that I reviewed her thyroid  ultrasound.  It reported no thyroid  enlargement and a very small nodule that does not meet criteria for any follow-up in the future, thanks

## 2024-07-25 NOTE — Telephone Encounter (Signed)
 Lvm for pt to call back.

## 2024-07-26 ENCOUNTER — Telehealth: Payer: Self-pay

## 2024-07-26 NOTE — Telephone Encounter (Signed)
 Spoke to pt regarding US  of thyroid  results.

## 2024-07-27 NOTE — Assessment & Plan Note (Signed)
 Managed with Armour thyroid  due to history of intolerance to Synthroid.  Thyroid  function has recently been managed by [facility] endocrinology  and dose was reduced due to mildly elevated  T3 in the setting of  normal TSH And Free T4. Since the dose reduction she has had multiple symptoms of underactive thyroid  and requests return to previous dosing . Repeat levels are pending   Last thyroid  functions Lab Results  Component Value Date   TSH 1.47 07/17/2024   T4TOTAL 5.6 03/23/2021   FREET4 0.77 07/17/2024   THYROIDAB 1 04/05/2023

## 2024-11-29 ENCOUNTER — Ambulatory Visit
